# Patient Record
Sex: Male | Born: 1975 | Race: White | Hispanic: No | Marital: Single | Smoking: Former smoker
Health system: Southern US, Community
[De-identification: ages and names within clinical notes are randomized; demographics above are authoritative.]

## PROBLEM LIST (undated history)

## (undated) DIAGNOSIS — I10 Essential (primary) hypertension: Secondary | ICD-10-CM

## (undated) DIAGNOSIS — T7840XA Allergy, unspecified, initial encounter: Secondary | ICD-10-CM

## (undated) DIAGNOSIS — A071 Giardiasis [lambliasis]: Secondary | ICD-10-CM

## (undated) DIAGNOSIS — R55 Syncope and collapse: Secondary | ICD-10-CM

## (undated) HISTORY — DX: Allergy, unspecified, initial encounter: T78.40XA

## (undated) HISTORY — DX: Essential (primary) hypertension: I10

## (undated) HISTORY — DX: Giardiasis (lambliasis): A07.1

## (undated) HISTORY — DX: Syncope and collapse: R55

---
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
## 1998-05-29 HISTORY — PX: SPERMATOCELECTOMY: SHX2420

## 1998-12-27 ENCOUNTER — Ambulatory Visit (HOSPITAL_BASED_OUTPATIENT_CLINIC_OR_DEPARTMENT_OTHER): Admission: RE | Admit: 1998-12-27 | Discharge: 1998-12-27 | Payer: Self-pay | Admitting: Urology

## 2001-01-24 ENCOUNTER — Emergency Department (HOSPITAL_COMMUNITY): Admission: EM | Admit: 2001-01-24 | Discharge: 2001-01-24 | Payer: Self-pay | Admitting: Emergency Medicine

## 2001-01-24 ENCOUNTER — Encounter: Payer: Self-pay | Admitting: Emergency Medicine

## 2004-12-23 ENCOUNTER — Ambulatory Visit: Payer: Self-pay | Admitting: Family Medicine

## 2004-12-27 ENCOUNTER — Ambulatory Visit: Payer: Self-pay | Admitting: Internal Medicine

## 2005-01-16 ENCOUNTER — Ambulatory Visit: Payer: Self-pay | Admitting: Internal Medicine

## 2005-10-10 ENCOUNTER — Ambulatory Visit: Payer: Self-pay | Admitting: Internal Medicine

## 2005-11-24 ENCOUNTER — Ambulatory Visit: Payer: Self-pay | Admitting: Internal Medicine

## 2006-02-20 ENCOUNTER — Ambulatory Visit: Payer: Self-pay | Admitting: Internal Medicine

## 2006-11-30 ENCOUNTER — Emergency Department (HOSPITAL_COMMUNITY): Admission: EM | Admit: 2006-11-30 | Discharge: 2006-11-30 | Payer: Self-pay | Admitting: Emergency Medicine

## 2007-05-30 DIAGNOSIS — A071 Giardiasis [lambliasis]: Secondary | ICD-10-CM

## 2007-05-30 HISTORY — DX: Giardiasis (lambliasis): A07.1

## 2007-07-18 ENCOUNTER — Encounter: Payer: Self-pay | Admitting: Internal Medicine

## 2007-12-24 ENCOUNTER — Ambulatory Visit (HOSPITAL_COMMUNITY): Admission: RE | Admit: 2007-12-24 | Discharge: 2007-12-24 | Payer: Self-pay | Admitting: Gastroenterology

## 2007-12-24 ENCOUNTER — Encounter (INDEPENDENT_AMBULATORY_CARE_PROVIDER_SITE_OTHER): Payer: Self-pay | Admitting: Gastroenterology

## 2008-12-04 ENCOUNTER — Telehealth: Payer: Self-pay | Admitting: Internal Medicine

## 2009-03-05 ENCOUNTER — Emergency Department (HOSPITAL_COMMUNITY): Admission: EM | Admit: 2009-03-05 | Discharge: 2009-03-05 | Payer: Self-pay | Admitting: Family Medicine

## 2010-10-11 NOTE — Op Note (Signed)
William Dorsey, William Dorsey                ACCOUNT NO.:  [id number]   MEDICAL RECORD NO.:  [id number]          PATIENT TYPE:  AMB   LOCATION:  ENDO                         FACILITY:  [facility]   PHYSICIAN:  Anselmo Rod, M.D.  DATE OF BIRTH:  July 13, 1975   DATE OF PROCEDURE:  12/24/2007  DATE OF DISCHARGE:                               OPERATIVE REPORT   PROCEDURE PERFORMED:  Esophagogastroduodenoscopy with small bowel  biopsies.   ENDOSCOPIST:  Anselmo Rod, M.D.   INSTRUMENT USED:  Pentax video panendoscope.   INDICATIONS FOR PROCEDURE:  A 35 year old white male with a history of  severe gas, bloating, diarrhea and abdominal discomfort, undergoing EGD  to rule out celiac sprue.   PREPROCEDURE PREPARATION:  Informed consent was procured from the  patient.  The patient fasted for 8 hours prior to the procedure.  The  risks and benefits of the procedure were discussed with the patient in  detail.   PREPROCEDURE PHYSICAL:  VITAL SIGNS:  The patient had stable vital  signs.  NECK:  Supple.  CHEST:  Clear to auscultation.  HEART:  S1-S2 regular.  ABDOMEN:  Soft with normal bowel sounds.   DESCRIPTION OF PROCEDURE:  The patient was placed in the left lateral  decubitus position, sedated with 100 mcg of Fentanyl and 10 mg of Versed  given intravenously in slow incremental doses.  Once the patient was  adequately sedated and maintained on low-flow oxygen and continuous  cardiac monitoring, the Pentax video panendoscope was advanced through  the mouthpiece over the tongue into the esophagus under direct vision.  The vocal cords appeared healthy.  The entire esophagus was widely  patent with no evidence of ring, stricture, mass, esophagitis or  Barrett's mucosa.  The Z-line appeared healthy.  The scope was then  advanced in the stomach.  Mild antral gastritis was noted.  The rest of  gastric mucosa appeared healthy.  Retroflexion in the high cardia  revealed no abnormality.  The proximal  small bowel appeared normal.  Small-bowel biopsies were done to rule out sprue.  The patient tolerated  the procedure well without complications.   IMPRESSION:  1. Normal-appearing esophagus and gastroesophageal junction.  2. Mild antral gastritis.  3. Normal proximal small bowel.  Small-bowel biopsies done to rule out      sprue.   RECOMMENDATIONS:  1. Await pathology results.  2. Proceed with a colonoscopy at this time.  3. Further recommendation to be made thereafter.  4. Avoid all nonsteroidals for now.      Anselmo Rod, M.D.  Electronically Signed     JNM/MEDQ  D:  12/25/2007  T:  12/25/2007  Job:  [id number]   cc:   Elease Hashimoto A. Benedetto Goad, M.D.  Fax: [phone]

## 2010-10-11 NOTE — Op Note (Signed)
NAMERAMESES, OU                ACCOUNT NO.:  [id number]   MEDICAL RECORD NO.:  [id number]          PATIENT TYPE:  AMB   LOCATION:  ENDO                         FACILITY:  [facility]   PHYSICIAN:  Anselmo Rod, M.D.  DATE OF BIRTH:  [birth year]   DATE OF PROCEDURE:  12/24/2007  DATE OF DISCHARGE:                               OPERATIVE REPORT   PROCEDURE PERFORMED:  Colonoscopy with multiple cold biopsies.   ENDOSCOPIST:  Dr. Anselmo Rod.   INSTRUMENT USED:  Pentax video colonoscope.   INDICATIONS FOR PROCEDURE:  A 35 year old white male with a history of  change in bowel habits gas, bloating and diarrhea undergoing colonoscopy  to rule out microscopic colitis, masses, polyps, IBD, etc.   PREPROCEDURE PREPARATION:  Informed consent was procured from the  patient.  The patient fasted for 8 hours prior to the procedure and  prepped with OsmoPrep 32 the night prior to the procedure.  The risks  and benefits of the procedure including a 10% missed rate of cancer and  polyp were discussed with the patient as well.   PREPROCEDURE PHYSICAL:  VITAL SIGNS:  The patient had stable vital  signs.  NECK:  Supple.  CLEAR:  Clear to auscultation.  HEART:  S1-S2 regular.  ABDOMEN:  Soft with normal bowel sounds.   DESCRIPTION OF PROCEDURE:  The patient was placed in the left lateral  decubitus position and sedated with an additional 50 mcg of Fentanyl and  5 mg of Versed given intravenously in slow incremental doses.  Once the  patient was adequately sedated and maintained on low-flow oxygen and  continuous cardiac monitor, the Pentax video colonoscope was advanced  from the rectum to the cecum.  The appendiceal orifice and the cecum  were clearly visualized and photographed.  The terminal ileum appeared  healthy without lesions.  Patchy loss of vascular markings were noted.  Biopsies were done to the presence of microscopic colitis.  Small  internal hemorrhoids were seen on  retroflexion.  The rest of the exam  was unremarkable.  No masses, polyps, erosions, ulcerations or  diverticula were identified.  There was no evidence of IBD.  The patient  tolerated the procedure well without immediate complications.   IMPRESSION:  1. Patchy loss of vascular markings.  Multiple biopsies done to rule      out microscopic colitis.  2. Small internal hemorrhoids.   RECOMMENDATIONS:  1. Await pathology results.  2. Avoid all nonsteroidals, including aspirin for the next 2 weeks.  3. Further recommendation will be made depending on the biopsy      results.      Anselmo Rod, M.D.  Electronically Signed     JNM/MEDQ  D:  12/25/2007  T:  12/25/2007  Job:  [id number]   cc:   Elease Hashimoto A. Benedetto Goad, M.D.  Fax: [phone]

## 2010-11-24 ENCOUNTER — Other Ambulatory Visit: Payer: Self-pay | Admitting: *Deleted

## 2010-11-24 ENCOUNTER — Encounter: Payer: Self-pay | Admitting: Family

## 2010-11-24 MED ORDER — POTASSIUM CHLORIDE CRYS ER 20 MEQ PO TBCR: 20.0000 meq | EXTENDED_RELEASE_TABLET | Freq: Every day | ORAL | Status: DC

## 2010-11-24 MED ORDER — LISINOPRIL-HYDROCHLOROTHIAZIDE 20-25 MG PO TABS: 1.0000 | ORAL_TABLET | Freq: Every day | ORAL | Status: DC

## 2010-11-24 NOTE — Telephone Encounter (Signed)
Addended by: Noralee Space on: 11/24/2010 02:39 PM   Modules accepted: Orders

## 2010-11-25 ENCOUNTER — Ambulatory Visit (INDEPENDENT_AMBULATORY_CARE_PROVIDER_SITE_OTHER): Payer: 59 | Admitting: Family

## 2010-11-25 ENCOUNTER — Encounter: Payer: Self-pay | Admitting: Family

## 2010-11-25 DIAGNOSIS — G4700 Insomnia, unspecified: Secondary | ICD-10-CM | POA: Insufficient documentation

## 2010-11-25 DIAGNOSIS — K649 Unspecified hemorrhoids: Secondary | ICD-10-CM | POA: Insufficient documentation

## 2010-11-25 DIAGNOSIS — I10 Essential (primary) hypertension: Secondary | ICD-10-CM | POA: Insufficient documentation

## 2010-11-25 LAB — BASIC METABOLIC PANEL
CO2: 26 mEq/L (ref 19–32)
Calcium: 9.8 mg/dL (ref 8.4–10.5)
Chloride: 101 mEq/L (ref 96–112)
Glucose, Bld: 91 mg/dL (ref 70–99)
Potassium: 4.1 mEq/L (ref 3.5–5.3)
Sodium: 139 mEq/L (ref 135–145)

## 2010-11-25 MED ORDER — HYDROCORTISONE ACE-PRAMOXINE 1-1 % RE FOAM: 1.0000 | Freq: Two times a day (BID) | RECTAL | Status: AC

## 2010-11-25 MED ORDER — ALPRAZOLAM 0.25 MG PO TABS: 0.2500 mg | ORAL_TABLET | Freq: Every evening | ORAL | Status: AC | PRN

## 2010-11-25 MED ORDER — FLUNISOLIDE 29 MCG/ACT NA SOLN: 2.0000 | Freq: Two times a day (BID) | NASAL | Status: DC

## 2010-11-25 NOTE — Assessment & Plan Note (Signed)
BP Readings from Last 3 Encounters:  11/25/10 130/86  BP stable on current regimen, continue same, check BMET

## 2010-11-25 NOTE — Progress Notes (Signed)
Subjective:    Patient ID: William Dorsey, male    DOB: [birth year], 35 y.o.   MRN: [id number]  HPI  Insomnia-  Reports that he was on celexa- took for 1 month.  Felt very somnolent on that medication and had to discontinue.  Falls asleep, but then wakes up and thinks about what he needs to do.  When he worked nights he reports that he took alprazolam 0.25mg  at bedtime and slept "like a baby."  HTN- reports that dr. Jones Broom refilled his lisinopril-hctz and Kdur.  Denies, HA, swelling, SOB  Fainting spells-  reports 4 episodes total- last episode was 2002.  W/u was negative. 2 were in the shower, ?vasovagal syncope.  Hemorrhoids- flare up occasionally, uses proctofoam PRN.    Review of Systems  Constitutional: Negative for fever.  HENT: Negative for hearing loss.   Eyes: Negative for visual disturbance.  Respiratory: Negative for shortness of breath.   Cardiovascular: Negative for chest pain.  Gastrointestinal: Negative for diarrhea.  Genitourinary: Negative for dysuria and urgency.  Musculoskeletal: Negative for back pain.  Skin:       Eczema right ring finger  Neurological: Negative for weakness and numbness.  Hematological: Negative for adenopathy.  Psychiatric/Behavioral:       Denies concerns with anxiety or depression   Past Medical History  Diagnosis Date  . Allergy   . Fainting spell   . Hypertension   . Giardia 2009    History   Social History  . Marital Status: Single    Spouse Name: N/A    Number of Children: 0  . Years of Education: N/A   Occupational History  . Nurse in Cath Lab at [employer]   Social History Main Topics  . Smoking status: Former Smoker -- 10 years  . Smokeless tobacco: Never Used  . Alcohol Use: 3.6 oz/week    6 Cans of beer per week     on weekends  . Drug Use: No  . Sexually Active: Not on file   Other Topics Concern  . Not on file   Social History Narrative   Regular Exercise: 6 days weeklyCaffeine use:  4 cups  coffee dailyWorks in cath lab.  3 dogs (2 schnauzers and a terrier pound dog)Has a partner who lives in [place]- he works in the Radi[employer], therapeutic.     Past Surgical History  Procedure Date  . Tonsillectomy 1992    Done in [place]  . Spermatocelectomy 2000    Dr Isabel Caprice    Family History  Problem Relation Age of Onset  . Hypertension Mother   . Hyperlipidemia Father   . Hypertension Father   . Gout Father   . Stroke Maternal Grandmother   . Hypertension Maternal Grandmother   . Cancer Maternal Grandfather     lung  . Arthritis Paternal Grandmother     rheumatoid  . Arthritis Paternal Grandfather     rheumatoid  . Gout Paternal Grandfather     No Known Allergies  Current Outpatient Prescriptions on File Prior to Visit  Medication Sig Dispense Refill  . lisinopril-hydrochlorothiazide (PRINZIDE,ZESTORETIC) 20-25 MG per tablet Take 1 tablet by mouth daily.  90 tablet  1  . potassium chloride SA (KLOR-CON M20) 20 MEQ tablet Take 1 tablet (20 mEq total) by mouth daily.  90 tablet  1    BP 130/86  Pulse 72  Temp(Src) 98 F (36.7 C) (Oral)  Resp 16  Ht 5' 9.5" (1.765 m)  Wt 195 lb  1.9 oz (88.506 kg)  BMI 28.40 kg/m2       Objective:   Physical Exam  Constitutional: He appears well-developed and well-nourished.  HENT:  Head: Normocephalic and atraumatic.  Right Ear: Tympanic membrane normal.  Left Ear: Tympanic membrane normal.  Nose: Nose normal.  Mouth/Throat: Uvula is midline, oropharynx is clear and moist and mucous membranes are normal.  Cardiovascular: Normal rate and regular rhythm.   Pulmonary/Chest: Effort normal and breath sounds normal.  Skin: Skin is warm and dry.  Psychiatric: He has a normal mood and affect. His behavior is normal. Judgment and thought content normal.          Assessment & Plan:

## 2010-11-25 NOTE — Assessment & Plan Note (Signed)
Stable, continue proctofoam PRN

## 2010-11-25 NOTE — Assessment & Plan Note (Signed)
Suspect that mild anxiety is playing a role.  Intolerant to [employer]  Will rx low dose alprazolam.

## 2010-11-25 NOTE — Patient Instructions (Signed)
Please follow up fasting this summer for a complete physical.

## 2010-11-27 ENCOUNTER — Encounter: Payer: Self-pay | Admitting: Family

## 2011-08-16 ENCOUNTER — Ambulatory Visit (INDEPENDENT_AMBULATORY_CARE_PROVIDER_SITE_OTHER): Payer: 59 | Admitting: Family

## 2011-08-16 ENCOUNTER — Encounter: Payer: Self-pay | Admitting: Family

## 2011-08-16 VITALS — BP 130/96 | HR 59 | Temp 97.7°F | Resp 16 | Ht 69.5 in | Wt 187.1 lb

## 2011-08-16 DIAGNOSIS — G4700 Insomnia, unspecified: Secondary | ICD-10-CM

## 2011-08-16 DIAGNOSIS — M2550 Pain in unspecified joint: Secondary | ICD-10-CM

## 2011-08-16 DIAGNOSIS — I10 Essential (primary) hypertension: Secondary | ICD-10-CM

## 2011-08-16 DIAGNOSIS — Z0000 Encounter for general adult medical examination without abnormal findings: Secondary | ICD-10-CM | POA: Insufficient documentation

## 2011-08-16 LAB — LIPID PANEL
Cholesterol: 170 mg/dL (ref 0–200)
Triglycerides: 131 mg/dL (ref <150)

## 2011-08-16 LAB — CBC WITH DIFFERENTIAL/PLATELET
Basophils Absolute: 0 10*3/uL (ref 0.0–0.1)
Basophils Relative: 0 % (ref 0–1)
Eosinophils Absolute: 0.1 10*3/uL (ref 0.0–0.7)
Eosinophils Relative: 2 % (ref 0–5)
HCT: 46.5 % (ref 39.0–52.0)
MCH: 31 pg (ref 26.0–34.0)
MCHC: 35.5 g/dL (ref 30.0–36.0)
MCV: 87.2 fL (ref 78.0–100.0)
Monocytes Absolute: 0.4 10*3/uL (ref 0.1–1.0)
RDW: 12.4 % (ref 11.5–15.5)

## 2011-08-16 LAB — BASIC METABOLIC PANEL WITH GFR
CO2: 22 mEq/L (ref 19–32)
Calcium: 9.4 mg/dL (ref 8.4–10.5)
Creat: 0.93 mg/dL (ref 0.50–1.35)
GFR, Est African American: >89 mL/min
Sodium: 137 mEq/L (ref 135–145)

## 2011-08-16 LAB — HEPATIC FUNCTION PANEL
ALT: 21 U/L (ref 0–53)
AST: 22 U/L (ref 0–37)
Total Protein: 7.1 g/dL (ref 6.0–8.3)

## 2011-08-16 MED ORDER — LISINOPRIL 10 MG PO TABS: 10.0000 mg | ORAL_TABLET | Freq: Every day | ORAL | Status: DC

## 2011-08-16 NOTE — Patient Instructions (Signed)
Please complete your blood work prior to leaving today. Follow up in 1 month. 

## 2011-08-16 NOTE — Assessment & Plan Note (Signed)
I commended patient on his healthy diet, exercise. Immunizations reviewed, up to date- per pt.  Obtain fasting laboratories.

## 2011-08-16 NOTE — Assessment & Plan Note (Signed)
Stable with PRN xanax.

## 2011-08-16 NOTE — Progress Notes (Signed)
Subjective:    Patient ID: William Dorsey, male    DOB: 1976/04/15, 36 y.o.   MRN: [id number]  HPI  William Dorsey is a 36 yr old male who presents today for complete physical.  Preventative- last tetanus- thinks <10 yrs ago. Exercise-training for a marathon Diet- Eats healthy.     HTN- currently on lisinopril-HCTZ- but has been "weaning" himself down.  No only on 1/4 pill. Reports that he is training for a marathon and "runs" much better when he is off of the BP meds.   Insomnia- uses xanax about 2x a month.    Review of Systems  Constitutional: Negative for unexpected weight change.  HENT: Negative for congestion.   Eyes: Negative for visual disturbance.  Respiratory: Negative for cough.   Cardiovascular: Negative for chest pain.  Gastrointestinal: Positive for vomiting. Negative for diarrhea, constipation and blood in stool.  Genitourinary: Negative for dysuria, frequency and hematuria.  Musculoskeletal: Negative for myalgias.       Some joint pain in hands.    Neurological: Negative for headaches.  Hematological: Negative for adenopathy.  Psychiatric/Behavioral:       Denies depression/anxiety   Past Medical History  Diagnosis Date  . Allergy   . Fainting spell   . Hypertension   . Giardia 2009    History   Social History  . Marital Status: Single    Spouse Name: N/A    Number of Children: 0  . Years of Education: N/A   Occupational History  . Nurse in Cath Lab at [employer]   Social History Main Topics  . Smoking status: Former Smoker -- 10 years  . Smokeless tobacco: Never Used  . Alcohol Use: 3.6 oz/week    6 Cans of beer per week     on weekends  . Drug Use: No  . Sexually Active: Not on file   Other Topics Concern  . Not on file   Social History Narrative   Regular Exercise: 6 days weeklyCaffeine use:  4 cups coffee dailyWorks in cath lab.  3 dogs (2 schnauzers and a terrier pound dog)Has a partner who lives in [place]- he works in the  Radiographer, therapeutic.     Past Surgical History  Procedure Date  . Tonsillectomy 1992    Done in [place]  . Spermatocelectomy 2000    Dr Isabel Caprice    Family History  Problem Relation Age of Onset  . Hypertension Mother   . Hyperlipidemia Father   . Hypertension Father   . Gout Father   . Stroke Maternal Grandmother   . Hypertension Maternal Grandmother   . Cancer Maternal Grandfather     lung  . Arthritis Paternal Grandmother     rheumatoid  . Arthritis Paternal Grandfather     rheumatoid  . Gout Paternal Grandfather     No Known Allergies  Current Outpatient Prescriptions on File Prior to Visit  Medication Sig Dispense Refill  . flunisolide (NASAREL) 29 MCG/ACT (0.025%) nasal spray Place 2 sprays into the nose 2 (two) times daily. Dose is for each nostril.  25 mL  3  . loratadine (CLARITIN) 10 MG tablet Take 10 mg by mouth as needed.        . Pramoxine HCl (PROCTOFOAM RE) Place rectally as needed.          BP 130/96  Pulse 59  Temp(Src) 97.7 F (36.5 C) (Oral)  Resp 16  Ht 5' 9.5" (1.765 m)  Wt 187 lb 1.3  oz (84.859 kg)  BMI 27.23 kg/m2  SpO2 99%        Objective:   Physical Exam Physical Exam  Constitutional: He is oriented to person, place, and time. He appears well-developed and well-nourished. No distress.  HENT:  Head: Normocephalic and atraumatic.  Right Ear: Tympanic membrane and ear canal normal.  Left Ear: Tympanic membrane and ear canal normal.  Mouth/Throat: Oropharynx is clear and moist.  Eyes: Pupils are equal, round, and reactive to light. No scleral icterus.  Neck: Normal range of motion. No thyromegaly present.  Cardiovascular: Normal rate and regular rhythm.   No murmur heard. Pulmonary/Chest: Effort normal and breath sounds normal. No respiratory distress. He has no wheezes. He has no rales. He exhibits no tenderness.  Abdominal: Soft. Bowel sounds are normal. He exhibits no distension and no mass. There is no tenderness. There is no  rebound and no guarding.  Musculoskeletal: He exhibits no edema.  Lymphadenopathy:    He has no cervical adenopathy.  Neurological: He is alert and oriented to person, place, and time. He exhibits normal muscle tone. Coordination normal.  Skin: Skin is warm and dry.  Psychiatric: He has a normal mood and affect. His behavior is normal. Judgment and thought content normal.          Assessment & Plan:         Assessment & Plan:

## 2011-08-16 NOTE — Assessment & Plan Note (Signed)
Deteriorated.   BP Readings from Last 3 Encounters:  08/16/11 130/96  11/25/10 130/86   He wishes to try lisinopril 10mg  once daily alone. This is reasonable.  Will plan follow up in 1 month.

## 2011-08-17 LAB — HIV ANTIBODY (ROUTINE TESTING W REFLEX): HIV: NONREACTIVE

## 2011-08-17 LAB — RHEUMATOID FACTOR: Rhuematoid fact SerPl-aCnc: <10 IU/mL (ref <=14)

## 2011-09-20 ENCOUNTER — Encounter: Payer: Self-pay | Admitting: Family

## 2011-09-20 ENCOUNTER — Ambulatory Visit (INDEPENDENT_AMBULATORY_CARE_PROVIDER_SITE_OTHER): Payer: 59 | Admitting: Family

## 2011-09-20 VITALS — BP 144/82 | HR 61 | Temp 98.1°F | Resp 16 | Ht 69.5 in | Wt 184.1 lb

## 2011-09-20 DIAGNOSIS — I10 Essential (primary) hypertension: Secondary | ICD-10-CM

## 2011-09-20 MED ORDER — ALPRAZOLAM 0.25 MG PO TABS: 0.2500 mg | ORAL_TABLET | Freq: Every evening | ORAL | Status: DC | PRN

## 2011-09-20 MED ORDER — LISINOPRIL 10 MG PO TABS: 10.0000 mg | ORAL_TABLET | Freq: Every day | ORAL | Status: DC

## 2011-09-20 NOTE — Patient Instructions (Signed)
Please schedule a follow up appointment in 3 months.

## 2011-09-20 NOTE — Progress Notes (Signed)
  Subjective:    Patient ID: WAYLYN TENBRINK, male    DOB: [birth year], 36 y.o.   MRN: [id number]  HPI  Mr.  Wendell is a 36 yr old male who presents today for follow up of his blood pressure.  Last visit his lisinopril was increased and HCTZ was eliminated.  He reports that he has been checking his sugars regularly at work and they have been running 120's/70-80's. He is feeling better off of the hctz.  He has been doing a lot of running and is feeling good running.   Review of Systems     Objective:   Physical Exam  Constitutional: He appears well-developed and well-nourished.  Cardiovascular: Normal rate and regular rhythm.   Pulmonary/Chest: Effort normal and breath sounds normal.  Musculoskeletal: He exhibits no edema.          Assessment & Plan:

## 2011-09-21 NOTE — Assessment & Plan Note (Signed)
BP Readings from Last 3 Encounters:  09/20/11 144/82  08/16/11 130/96  11/25/10 130/86  DBP improved, SBP slightly above goal in office, but readings at work have been much better.  Will continue current dose and plan to have him follow up in 3 months.

## 2011-10-16 ENCOUNTER — Other Ambulatory Visit: Payer: Self-pay | Admitting: Family

## 2011-10-16 NOTE — Telephone Encounter (Signed)
Lisinopril request denied as refills was sent to pharmacy on 09/20/11 #90 x no refills. Sent note to check refill on file.

## 2012-01-08 ENCOUNTER — Other Ambulatory Visit: Payer: Self-pay | Admitting: Family

## 2012-01-08 NOTE — Telephone Encounter (Signed)
Please let pt know that a 90 day supply of lisinopril was sent to his pharmacy. He was due for f/u in July. Please call pt to arrange appt as further refills cannot be given until he is seen.

## 2012-01-09 NOTE — Telephone Encounter (Signed)
Left detailed message informing patient that a 90 day supply of lisinopril has been sent to pharmacy and that patient needs to be seen for follow up before additional refills can be given.

## 2012-01-20 ENCOUNTER — Encounter: Payer: Self-pay | Admitting: Family

## 2012-04-16 ENCOUNTER — Encounter: Payer: Self-pay | Admitting: Family

## 2012-04-16 ENCOUNTER — Ambulatory Visit (INDEPENDENT_AMBULATORY_CARE_PROVIDER_SITE_OTHER): Payer: 59 | Admitting: Family

## 2012-04-16 VITALS — BP 144/90 | HR 67 | Temp 98.3°F | Resp 16 | Ht 69.5 in | Wt 190.1 lb

## 2012-04-16 DIAGNOSIS — I10 Essential (primary) hypertension: Secondary | ICD-10-CM

## 2012-04-16 MED ORDER — LOSARTAN POTASSIUM 25 MG PO TABS: 25.0000 mg | ORAL_TABLET | Freq: Every day | ORAL | Status: DC

## 2012-04-16 NOTE — Patient Instructions (Addendum)
Please schedule a follow up appointment in 1 month.

## 2012-04-16 NOTE — Assessment & Plan Note (Signed)
Will give trial of losartan. If no improvement try norvasc- he has tried in the past- does not remember if side effects.

## 2012-04-16 NOTE — Progress Notes (Signed)
Subjective:    Patient ID: William Dorsey, male    DOB: 1975/11/09, 36 y.o.   MRN: [id number]  HPI  William Dorsey is a 36 yr old male who presents today for follow up of his HTN.   He reports + fatigue on lisinopril.  Only taking every other day as a result.  Reports HA when blood pressure is this high.   Did not take today.  He has tried multiple other medications in the past which he has been intolerant to includng benicar/diovan, beta blocker,lotrel.  He would like to try losartan.   Review of Systems See HPI  Past Medical History  Diagnosis Date  . Allergy   . Fainting spell   . Hypertension   . Giardia 2009    History   Social History  . Marital Status: Single    Spouse Name: N/A    Number of Children: 0  . Years of Education: N/A   Occupational History  . Nurse in Cath Lab at [employer]   Social History Main Topics  . Smoking status: Former Smoker -- 10 years  . Smokeless tobacco: Never Used  . Alcohol Use: 3.6 oz/week    6 Cans of beer per week     Comment: on weekends  . Drug Use: No  . Sexually Active: Not on file   Other Topics Concern  . Not on file   Social History Narrative   Regular Exercise: 6 days weeklyCaffeine use:  4 cups coffee dailyWorks in cath lab.  3 dogs (2 schnauzers and a terrier pound dog)Has a partner who lives in [place]- he works in the Radiographer, therapeutic.     Past Surgical History  Procedure Date  . Tonsillectomy 1992    Done in [place]  . Spermatocelectomy 2000    Dr Isabel Caprice    Family History  Problem Relation Age of Onset  . Hypertension Mother   . Hyperlipidemia Father   . Hypertension Father   . Gout Father   . Stroke Maternal Grandmother   . Hypertension Maternal Grandmother   . Cancer Maternal Grandfather     lung  . Arthritis Paternal Grandmother     rheumatoid  . Arthritis Paternal Grandfather     rheumatoid  . Gout Paternal Grandfather     No Known Allergies  Current Outpatient Prescriptions on File  Prior to Visit  Medication Sig Dispense Refill  . ALPRAZolam (XANAX) 0.25 MG tablet Take 1 tablet (0.25 mg total) by mouth at bedtime as needed.  30 tablet  0  . flunisolide (NASAREL) 29 MCG/ACT (0.025%) nasal spray Place 2 sprays into the nose 2 (two) times daily. Dose is for each nostril.  25 mL  3  . loratadine (CLARITIN) 10 MG tablet Take 10 mg by mouth as needed.        . Multiple Vitamin (MULTIVITAMIN) tablet Take 1 tablet by mouth daily.      . Pramoxine HCl (PROCTOFOAM RE) Place rectally as needed.        Marland Kitchen losartan (COZAAR) 25 MG tablet Take 1 tablet (25 mg total) by mouth daily.  30 tablet  0    BP 144/90  Pulse 67  Temp 98.3 F (36.8 C) (Oral)  Resp 16  Ht 5' 9.5" (1.765 m)  Wt 190 lb 1.9 oz (86.238 kg)  BMI 27.67 kg/m2  SpO2 97%       Objective:   Physical Exam  Constitutional: He appears well-developed and well-nourished. No distress.  Cardiovascular: Normal rate and regular rhythm.   No murmur heard. Pulmonary/Chest: Effort normal and breath sounds normal. No respiratory distress. He has no wheezes. He has no rales. He exhibits no tenderness.  Psychiatric: He has a normal mood and affect. His behavior is normal. Judgment and thought content normal.          Assessment & Plan:

## 2012-05-13 ENCOUNTER — Encounter: Payer: Self-pay | Admitting: Family

## 2012-05-13 ENCOUNTER — Ambulatory Visit (INDEPENDENT_AMBULATORY_CARE_PROVIDER_SITE_OTHER): Payer: 59 | Admitting: Family

## 2012-05-13 VITALS — BP 144/92 | HR 52 | Temp 98.6°F | Resp 16 | Ht 69.5 in | Wt 189.0 lb

## 2012-05-13 DIAGNOSIS — I10 Essential (primary) hypertension: Secondary | ICD-10-CM

## 2012-05-13 MED ORDER — LOSARTAN POTASSIUM 50 MG PO TABS: 50.0000 mg | ORAL_TABLET | Freq: Every day | ORAL | Status: DC

## 2012-05-13 NOTE — Assessment & Plan Note (Signed)
DBP still elevated.  Obtain BMET today.  Increase losartan from 25 to 50mg .  Follow up in 1 month.

## 2012-05-13 NOTE — Patient Instructions (Addendum)
Please complete your lab work prior to leaving.  Please schedule a follow up appointment in 1 months.

## 2012-05-13 NOTE — Progress Notes (Signed)
  Subjective:    Patient ID: William Dorsey, male    DOB: Feb 25, 1976, 36 y.o.   MRN: [id number]  HPI   William Dorsey is a 36 yr old male who presents today for follow up of his HTN. Reports overall feeling much better since switching from lisinopril to losartan.  At home and at work he has been checking his blood pressure and it generally is running 125-135 systolic, but the diastolic number is consistently above 90.  Reports some mild issues with sleep as well as constipation which are good.  He does however wish to continue the medication.  Review of Systems See HPI      Objective:   Physical Exam  Constitutional: He appears well-developed and well-nourished. No distress.  Eyes: No scleral icterus.  Cardiovascular: Normal rate and regular rhythm.   No murmur heard. Pulmonary/Chest: Effort normal and breath sounds normal. No respiratory distress. He has no wheezes. He has no rales. He exhibits no tenderness.  Musculoskeletal: He exhibits no edema.  Psychiatric: He has a normal mood and affect. His behavior is normal. Judgment and thought content normal.          Assessment & Plan:

## 2012-05-14 LAB — BASIC METABOLIC PANEL
BUN: 17 mg/dL (ref 6–23)
CO2: 26 mEq/L (ref 19–32)
Chloride: 102 mEq/L (ref 96–112)
Creat: 0.84 mg/dL (ref 0.50–1.35)

## 2012-06-12 ENCOUNTER — Ambulatory Visit (INDEPENDENT_AMBULATORY_CARE_PROVIDER_SITE_OTHER): Payer: 59 | Admitting: Family

## 2012-06-12 ENCOUNTER — Encounter: Payer: Self-pay | Admitting: Family

## 2012-06-12 VITALS — BP 140/94 | HR 64 | Temp 97.8°F | Resp 16 | Ht 69.5 in | Wt 187.0 lb

## 2012-06-12 DIAGNOSIS — I10 Essential (primary) hypertension: Secondary | ICD-10-CM

## 2012-06-12 LAB — BASIC METABOLIC PANEL
BUN: 22 mg/dL (ref 6–23)
Chloride: 104 mEq/L (ref 96–112)
Creat: 0.9 mg/dL (ref 0.50–1.35)

## 2012-06-12 MED ORDER — LOSARTAN POTASSIUM 50 MG PO TABS: 50.0000 mg | ORAL_TABLET | Freq: Every day | ORAL | Status: DC

## 2012-06-12 NOTE — Progress Notes (Signed)
Subjective:    Patient ID: William Dorsey, male    DOB: Jan 30, 1976, 37 y.o.   MRN: [id number]  HPI  William Dorsey is a 37 yr old male who presents today for follow up of his htn. Last visit losartan was increased from 25 to 50mg .  Reports that hs has been checking his blood pressure frequently at work and it has been 120's/70's.  Occasional DBP in the  80' systolic.  Mild fatigue. Denies cp/sob. Has been running regularly without difficulty.  Review of Systems See HPI  Past Medical History  Diagnosis Date  . Allergy   . Fainting spell   . Hypertension   . Giardia 2009    History   Social History  . Marital Status: Single    Spouse Name: N/A    Number of Children: 0  . Years of Education: N/A   Occupational History  . Nurse in Cath Lab at [employer]   Social History Main Topics  . Smoking status: Former Smoker -- 10 years  . Smokeless tobacco: Never Used  . Alcohol Use: 3.6 oz/week    6 Cans of beer per week     Comment: on weekends  . Drug Use: No  . Sexually Active: Not on file   Other Topics Concern  . Not on file   Social History Narrative   Regular Exercise: 6 days weeklyCaffeine use:  4 cups coffee dailyWorks in cath lab.  3 dogs (2 schnauzers and a terrier pound dog)Has a partner who lives in [place]- he works in the Radiographer, therapeutic.     Past Surgical History  Procedure Date  . Tonsillectomy 1992    Done in [place]  . Spermatocelectomy 2000    Dr Isabel Caprice    Family History  Problem Relation Age of Onset  . Hypertension Mother   . Hyperlipidemia Father   . Hypertension Father   . Gout Father   . Stroke Maternal Grandmother   . Hypertension Maternal Grandmother   . Cancer Maternal Grandfather     lung  . Arthritis Paternal Grandmother     rheumatoid  . Arthritis Paternal Grandfather     rheumatoid  . Gout Paternal Grandfather     No Known Allergies  Current Outpatient Prescriptions on File Prior to Visit  Medication Sig Dispense Refill    . ALPRAZolam (XANAX) 0.25 MG tablet Take 1 tablet (0.25 mg total) by mouth at bedtime as needed.  30 tablet  0  . Cholecalciferol (VITAMIN D) 1000 UNITS capsule Take 2,000 Units by mouth daily.      . flunisolide (NASAREL) 29 MCG/ACT (0.025%) nasal spray Place 2 sprays into the nose 2 (two) times daily. Dose is for each nostril.  25 mL  3  . loratadine (CLARITIN) 10 MG tablet Take 10 mg by mouth as needed.        Marland Kitchen losartan (COZAAR) 50 MG tablet Take 1 tablet (50 mg total) by mouth daily.  30 tablet  3  . Multiple Vitamin (MULTIVITAMIN) tablet Take 1 tablet by mouth daily.      . Pramoxine HCl (PROCTOFOAM RE) Place rectally as needed.          BP 140/94  Pulse 64  Temp 97.8 F (36.6 C) (Oral)  Resp 16  Ht 5' 9.5" (1.765 m)  Wt 187 lb (84.823 kg)  BMI 27.22 kg/m2  SpO2 98%       Objective:   Physical Exam  Constitutional: He appears well-developed and well-nourished.  No distress.  Cardiovascular: Normal rate and regular rhythm.   No murmur heard. Pulmonary/Chest: Effort normal and breath sounds normal. No respiratory distress. He has no wheezes. He has no rales. He exhibits no tenderness.          Assessment & Plan:   BP Readings from Last 3 Encounters:  06/12/12 140/94  05/13/12 144/92  04/16/12 144/90

## 2012-06-12 NOTE — Assessment & Plan Note (Signed)
BP improved overall. Higher in the office than when he checks it at work.  Continue current dose of losartan and obtain bmet to day.  Follow up in 3 months.

## 2012-06-12 NOTE — Patient Instructions (Addendum)
Please follow up in 3 months.  Complete your blood work prior to leaving.

## 2012-06-14 ENCOUNTER — Ambulatory Visit: Payer: 59 | Admitting: Family

## 2012-09-11 ENCOUNTER — Ambulatory Visit: Payer: 59 | Admitting: Family

## 2012-09-18 ENCOUNTER — Ambulatory Visit (INDEPENDENT_AMBULATORY_CARE_PROVIDER_SITE_OTHER): Payer: 59 | Admitting: Family

## 2012-09-18 ENCOUNTER — Encounter: Payer: Self-pay | Admitting: Family

## 2012-09-18 VITALS — BP 124/80 | HR 62 | Temp 98.2°F | Resp 16 | Ht 69.5 in | Wt 181.1 lb

## 2012-09-18 DIAGNOSIS — I10 Essential (primary) hypertension: Secondary | ICD-10-CM

## 2012-09-18 MED ORDER — LOSARTAN POTASSIUM 50 MG PO TABS: 50.0000 mg | ORAL_TABLET | Freq: Every day | ORAL | Status: DC

## 2012-09-18 NOTE — Progress Notes (Signed)
  Subjective:    Patient ID: William Dorsey, male    DOB: June 19, 1975, 37 y.o.   MRN: [id number]  HPI  Corban is a 37 yr old male here today for follow up of his hypertension.  He is currently maintained on losartan 50 mg. Denies cp, sob or swelling.     Review of Systems See HPI    Objective:   Physical Exam  Constitutional: He is oriented to person, place, and time. He appears well-developed and well-nourished. No distress.  HENT:  Head: Normocephalic and atraumatic.  Cardiovascular: Normal rate and regular rhythm.   No murmur heard. Pulmonary/Chest: Effort normal and breath sounds normal. No respiratory distress. He has no wheezes. He has no rales. He exhibits no tenderness.  Neurological: He is alert and oriented to person, place, and time.  Psychiatric: He has a normal mood and affect. His behavior is normal. Judgment and thought content normal.          Assessment & Plan:

## 2012-09-18 NOTE — Patient Instructions (Addendum)
Please complete blood work at [facility] in 3 months. Follow up in 6 months for a complete physical.

## 2012-09-21 NOTE — Assessment & Plan Note (Signed)
Improved.  Continue current dose of losartan.

## 2013-03-17 ENCOUNTER — Encounter: Payer: 59 | Admitting: Family

## 2013-04-03 ENCOUNTER — Other Ambulatory Visit: Payer: Self-pay

## 2015-07-15 DIAGNOSIS — Z125 Encounter for screening for malignant neoplasm of prostate: Secondary | ICD-10-CM | POA: Diagnosis not present

## 2015-07-15 DIAGNOSIS — I10 Essential (primary) hypertension: Secondary | ICD-10-CM | POA: Diagnosis not present

## 2015-07-15 DIAGNOSIS — Z79899 Other long term (current) drug therapy: Secondary | ICD-10-CM | POA: Diagnosis not present

## 2015-08-02 DIAGNOSIS — Z0000 Encounter for general adult medical examination without abnormal findings: Secondary | ICD-10-CM | POA: Diagnosis not present

## 2015-08-02 DIAGNOSIS — I10 Essential (primary) hypertension: Secondary | ICD-10-CM | POA: Diagnosis not present

## 2015-08-02 DIAGNOSIS — Z125 Encounter for screening for malignant neoplasm of prostate: Secondary | ICD-10-CM | POA: Diagnosis not present

## 2015-09-03 MED FILL — LISINOPRIL 20 MG TABLET: 20 | 90 days supply | Qty: 90 | Fill #4

## 2015-12-20 MED FILL — LISINOPRIL 20 MG TABLET: 20 | 90 days supply | Qty: 90 | Fill #0

## 2016-04-17 MED FILL — LISINOPRIL 20 MG TABLET: 20 | 90 days supply | Qty: 90 | Fill #1

## 2016-05-15 DIAGNOSIS — H52223 Regular astigmatism, bilateral: Secondary | ICD-10-CM | POA: Diagnosis not present

## 2016-05-15 DIAGNOSIS — H5213 Myopia, bilateral: Secondary | ICD-10-CM | POA: Diagnosis not present

## 2016-07-27 MED FILL — LISINOPRIL 20 MG TABLET: 20 | 90 days supply | Qty: 90 | Fill #2

## 2017-08-22 DIAGNOSIS — Z1321 Encounter for screening for nutritional disorder: Secondary | ICD-10-CM | POA: Diagnosis not present

## 2017-08-22 DIAGNOSIS — L309 Dermatitis, unspecified: Secondary | ICD-10-CM | POA: Diagnosis not present

## 2017-08-22 DIAGNOSIS — Z0000 Encounter for general adult medical examination without abnormal findings: Secondary | ICD-10-CM | POA: Diagnosis not present

## 2017-08-22 DIAGNOSIS — G4700 Insomnia, unspecified: Secondary | ICD-10-CM | POA: Diagnosis not present

## 2017-08-22 DIAGNOSIS — I10 Essential (primary) hypertension: Secondary | ICD-10-CM | POA: Diagnosis not present

## 2017-08-22 DIAGNOSIS — Z125 Encounter for screening for malignant neoplasm of prostate: Secondary | ICD-10-CM | POA: Diagnosis not present

## 2017-08-22 MED FILL — CLOBETASOL PROPIONATE 0.05: 0.05 | 30 days supply | Qty: 45 | Fill #0

## 2017-08-22 MED FILL — LISINOPRIL 20 MG TABLET: 20 | 90 days supply | Qty: 90 | Fill #0

## 2017-08-23 MED FILL — ALPRAZolam 0.25 MG TABS: 0.25 | 30 days supply | Qty: 30 | Fill #0

## 2017-12-12 MED FILL — LISINOPRIL 20 MG TABLET: 20 | 90 days supply | Qty: 90 | Fill #1

## 2018-05-27 MED FILL — LISINOPRIL 20 MG TABLET: 20 | 90 days supply | Qty: 90 | Fill #2

## 2018-08-19 DIAGNOSIS — Z125 Encounter for screening for malignant neoplasm of prostate: Secondary | ICD-10-CM | POA: Diagnosis not present

## 2018-08-19 DIAGNOSIS — Z0000 Encounter for general adult medical examination without abnormal findings: Secondary | ICD-10-CM | POA: Diagnosis not present

## 2018-08-26 DIAGNOSIS — Z125 Encounter for screening for malignant neoplasm of prostate: Secondary | ICD-10-CM | POA: Diagnosis not present

## 2018-08-26 DIAGNOSIS — G4700 Insomnia, unspecified: Secondary | ICD-10-CM | POA: Diagnosis not present

## 2018-08-26 DIAGNOSIS — Z0000 Encounter for general adult medical examination without abnormal findings: Secondary | ICD-10-CM | POA: Diagnosis not present

## 2018-08-26 DIAGNOSIS — I10 Essential (primary) hypertension: Secondary | ICD-10-CM | POA: Diagnosis not present

## 2018-08-26 DIAGNOSIS — E782 Mixed hyperlipidemia: Secondary | ICD-10-CM | POA: Diagnosis not present

## 2018-08-26 DIAGNOSIS — L309 Dermatitis, unspecified: Secondary | ICD-10-CM | POA: Diagnosis not present

## 2018-08-26 MED FILL — LISINOPRIL 20 MG TABLET: 20 | 90 days supply | Qty: 90 | Fill #0

## 2018-08-26 MED FILL — ALPRAZolam 0.25 MG TABS: 0.25 | 30 days supply | Qty: 30 | Fill #0

## 2018-11-29 MED FILL — LISINOPRIL 20 MG TABLET: 20 | 90 days supply | Qty: 90 | Fill #1

## 2019-03-10 MED FILL — LISINOPRIL 20 MG TABLET: 20 | 90 days supply | Qty: 90 | Fill #2

## 2019-06-30 MED FILL — LISINOPRIL 20 MG TABLET: 20 | 90 days supply | Qty: 90 | Fill #3

## 2019-08-25 DIAGNOSIS — Z0000 Encounter for general adult medical examination without abnormal findings: Secondary | ICD-10-CM | POA: Diagnosis not present

## 2019-10-13 ENCOUNTER — Other Ambulatory Visit (HOSPITAL_COMMUNITY): Payer: Self-pay | Admitting: Internal Medicine

## 2019-10-13 DIAGNOSIS — G4700 Insomnia, unspecified: Secondary | ICD-10-CM | POA: Diagnosis not present

## 2019-10-13 DIAGNOSIS — F419 Anxiety disorder, unspecified: Secondary | ICD-10-CM | POA: Diagnosis not present

## 2019-10-13 DIAGNOSIS — L309 Dermatitis, unspecified: Secondary | ICD-10-CM | POA: Diagnosis not present

## 2019-10-13 DIAGNOSIS — L719 Rosacea, unspecified: Secondary | ICD-10-CM | POA: Diagnosis not present

## 2019-10-13 DIAGNOSIS — I10 Essential (primary) hypertension: Secondary | ICD-10-CM | POA: Diagnosis not present

## 2019-10-13 DIAGNOSIS — Z0000 Encounter for general adult medical examination without abnormal findings: Secondary | ICD-10-CM | POA: Diagnosis not present

## 2019-10-13 MED FILL — LISINOPRIL 20 MG TABLET: 20 | 90 days supply | Qty: 90 | Fill #0

## 2019-10-13 MED FILL — CLOBETASOL PROPIONATE 0.05: 0.05 | 30 days supply | Qty: 45 | Fill #0

## 2019-10-13 MED FILL — ALPRAZolam 0.25 MG TABS: 0.25 | 30 days supply | Qty: 30 | Fill #0

## 2020-02-03 MED FILL — LISINOPRIL 20 MG TABLET: 20 | 90 days supply | Qty: 90 | Fill #1

## 2020-02-12 MED FILL — LISINOPRIL 20 MG TABLET: 20 | 90 days supply | Qty: 90 | Fill #1

## 2020-03-29 ENCOUNTER — Other Ambulatory Visit (HOSPITAL_COMMUNITY): Payer: Self-pay | Admitting: Internal Medicine

## 2020-03-29 DIAGNOSIS — R2681 Unsteadiness on feet: Secondary | ICD-10-CM | POA: Diagnosis not present

## 2020-03-29 DIAGNOSIS — R519 Headache, unspecified: Secondary | ICD-10-CM | POA: Diagnosis not present

## 2020-03-29 DIAGNOSIS — M792 Neuralgia and neuritis, unspecified: Secondary | ICD-10-CM | POA: Diagnosis not present

## 2020-03-29 DIAGNOSIS — R110 Nausea: Secondary | ICD-10-CM | POA: Diagnosis not present

## 2020-03-29 DIAGNOSIS — G8929 Other chronic pain: Secondary | ICD-10-CM | POA: Diagnosis not present

## 2020-03-29 MED FILL — CELECOXIB 200 MG CAP: 200 | 30 days supply | Qty: 30 | Fill #0

## 2020-03-30 ENCOUNTER — Other Ambulatory Visit: Payer: Self-pay | Admitting: Internal Medicine

## 2020-03-30 DIAGNOSIS — R2681 Unsteadiness on feet: Secondary | ICD-10-CM

## 2020-04-08 MED FILL — CELECOXIB 200 MG CAP: 200 | 30 days supply | Qty: 30 | Fill #0

## 2020-04-13 ENCOUNTER — Ambulatory Visit
Admission: RE | Admit: 2020-04-13 | Discharge: 2020-04-13 | Disposition: A | Discharge status: Home / Self Care | Payer: 59 | Source: Ambulatory Visit | Attending: Internal Medicine | Admitting: Internal Medicine

## 2020-04-13 ENCOUNTER — Other Ambulatory Visit: Payer: Self-pay

## 2020-04-13 DIAGNOSIS — R2681 Unsteadiness on feet: Secondary | ICD-10-CM

## 2020-04-13 DIAGNOSIS — R42 Dizziness and giddiness: Secondary | ICD-10-CM | POA: Diagnosis not present

## 2020-04-13 DIAGNOSIS — R55 Syncope and collapse: Secondary | ICD-10-CM | POA: Diagnosis not present

## 2020-04-13 DIAGNOSIS — R519 Headache, unspecified: Secondary | ICD-10-CM | POA: Diagnosis not present

## 2020-05-04 ENCOUNTER — Ambulatory Visit (INDEPENDENT_AMBULATORY_CARE_PROVIDER_SITE_OTHER): Payer: 59 | Admitting: Neurology

## 2020-05-04 ENCOUNTER — Encounter: Payer: Self-pay | Admitting: Neurology

## 2020-05-04 ENCOUNTER — Other Ambulatory Visit: Payer: Self-pay

## 2020-05-04 VITALS — BP 145/94 | HR 64 | Ht 64.5 in | Wt 182.0 lb

## 2020-05-04 DIAGNOSIS — F419 Anxiety disorder, unspecified: Secondary | ICD-10-CM | POA: Insufficient documentation

## 2020-05-04 DIAGNOSIS — R519 Headache, unspecified: Secondary | ICD-10-CM

## 2020-05-04 DIAGNOSIS — R200 Anesthesia of skin: Secondary | ICD-10-CM | POA: Insufficient documentation

## 2020-05-04 DIAGNOSIS — R5383 Other fatigue: Secondary | ICD-10-CM | POA: Diagnosis not present

## 2020-05-04 NOTE — Progress Notes (Signed)
[facility]  PATIENT: AZRIEL JAKOB DOB: Aug 20, 1975  REFERRING DOCTOR OR PCP: Dr. Nicholos Johns. SOURCE: Patient, notes from primary care, imaging and lab reports, CT scan personally reviewed.  _________________________________   HISTORICAL  CHIEF COMPLAINT:  Chief Complaint  Patient presents with  . New Patient (Initial Visit)    RM 13, alone. Paper referral from Perry, Ajith,MD for unsteadiness on feet/headaches/neuritis. wears glasses. 2nd covid vaccine caused severe headache, almost caused ambulance. Fever, chills, body aches for about 24 hours. does not feel he has recovered. Has a lot of fatigue. 03/13/20, he went to concert. Drank 6 beers. Next morning, woke up with same headache. Couple hours later, had blurry vision, felt like he was going to pass out. Took ibuprofen and laid down that day. Dizzy for 2 weeks after  . Headache    Had lasting headache, gradually got better last week. 06/20/19- 2nd covid shot.   . Numbness    Felt like arms/legs were going to sleep. Also noticed stiffness in arm and top of leg/hamstring.     HISTORY OF PRESENT ILLNESS:  I had the pleasure of seeing your patient, Mallory Enriques, at [facility] for neurologic consultation regarding his headaches, fatigue and left-sided numbness.  He is a 44 year old man who has had headaches and other symptoms that started after his second Covid-19 vacccination 06/20/2019.   After the vaccination he had a severe frontal headache and flu-like symptoms and dizziness.  He improved but still has issues.    He has felt very fatigued, both physically and cognitively over the summer but had no specific neurologic symptom.   Exercise became more difficult.  He went to a concert 03/13/20 with friends and did have about 6 drinks over 3 hours.   That night he had a severe headache.  He got out of bed and balance was very poor and he had visual blurring.     He took an ibuprofen and  laid down.  When he woke up, he still had a headache and dizziness.    At the end of October, he began to experience left sided numbness, leg > arm.  Balance was ok but he felt dizzy and lightheadedness.  Symptoms fluctuated.  Numbness resolved over the next week.   He continues to have a tight sensation in the left arm and leg like after a long workout.   He feels the left leg is heavier.   He is still able to jog 4 miles.without any problems and feels better afterwards.    He has had ED since mid October after the episode.   Bladder function is fine.   Vision is fine.     He still has fatigue.   He has sleep maintenance insomnia.   He does not snore.  No OSA signs.   He denies depression.   He feels he is high strung and has anxiety.    He noted reduced focus and memory over the summer but is better now.    Her mother was diagnosed with MS in 40 at age 56 but was symptomatic a few years before that.  She was in a wheelchair by the mid 1990's  CT Head 04/13/2020 was personally reviewed and is normal.   Labs performed at primary care were reviewed.  They were noncontributory.  He works as a Engineer, civil (consulting).   He tries to eat well and exercises regularly.   He takes cod liver oil, seaweed, algae, fish oil.  REVIEW OF SYSTEMS: Constitutional: No fevers, chills, sweats, or change in appetite.  He has fatigue Eyes: No visual changes, double vision, eye pain Ear, nose and throat: No hearing loss, ear pain, nasal congestion, sore throat Cardiovascular: No chest pain, palpitations Respiratory: No shortness of breath at rest or with exertion.   No wheezes GastrointestinaI: No nausea, vomiting, diarrhea, abdominal pain, fecal incontinence Genitourinary: No dysuria, urinary retention or frequency.  No nocturia. Musculoskeletal: No neck pain, back pain Integumentary: No rash, pruritus, skin lesions Neurological: as above Psychiatric: No depression at this time.  Some anxiety Endocrine: No palpitations,  diaphoresis, change in appetite, change in weigh or increased thirst Hematologic/Lymphatic: No anemia, purpura, petechiae. Allergic/Immunologic: No itchy/runny eyes, nasal congestion, recent allergic reactions, rashes  ALLERGIES: No Known Allergies  HOME MEDICATIONS:  Current Outpatient Medications:  .  ALPRAZolam (XANAX) 0.25 MG tablet, Take 1 tablet (0.25 mg total) by mouth at bedtime as needed., Disp: 30 tablet, Rfl: 0 .  Cholecalciferol (VITAMIN D) 1000 UNITS capsule, Take 2,000 Units by mouth daily., Disp: , Rfl:  .  flunisolide (NASAREL) 29 MCG/ACT (0.025%) nasal spray, Place 2 sprays into the nose 2 (two) times daily. Dose is for each nostril., Disp: 25 mL, Rfl: 3 .  lisinopril (ZESTRIL) 20 MG tablet, Take 20 mg by mouth daily., Disp: , Rfl:  .  loratadine (CLARITIN) 10 MG tablet, Take 10 mg by mouth as needed.  , Disp: , Rfl:  .  Multiple Vitamin (MULTIVITAMIN) tablet, Take 1 tablet by mouth daily., Disp: , Rfl:  .  Pramoxine HCl (PROCTOFOAM RE), Place rectally as needed.  , Disp: , Rfl:   PAST MEDICAL HISTORY: Past Medical History:  Diagnosis Date  . Allergy   . Fainting spell   . Giardia 2009  . Hypertension     PAST SURGICAL HISTORY: Past Surgical History:  Procedure Laterality Date  . SPERMATOCELECTOMY  2000   Dr Isabel Caprice  . TONSILLECTOMY  1992   Done in [place]    FAMILY HISTORY: Family History  Problem Relation Age of Onset  . Hypertension Mother   . Hyperlipidemia Father   . Hypertension Father   . Gout Father   . Cancer Maternal Grandfather        lung  . Stroke Maternal Grandmother   . Hypertension Maternal Grandmother   . Arthritis Paternal Grandmother        rheumatoid  . Arthritis Paternal Grandfather        rheumatoid  . Gout Paternal Grandfather     SOCIAL HISTORY:  Social History   Socioeconomic History  . Marital status: Single    Spouse name: Not on file  . Number of children: 0  . Years of education: Not on file  . Highest  education level: Not on file  Occupational History  . 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 Smoking status: Former Smoker    Years: 10.00  . Smokeless tobacco: Never Used  Substance and Sexual Activity  . Alcohol use: Yes    Alcohol/week: 6.0 standard drinks    Types: 6 Cans of beer per week    Comment: on weekends  . Drug use: No  . Sexual activity: Not on file  Other Topics Concern  . Not on file  Social History Narrative   Lives   Regular Exercise: 6 days weekly   Caffeine use:  4 cups coffee daily   Works in cath lab.  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       Social Determinants of Health   Financial Resource Strain:   . Difficulty of Paying Living Expenses: Not on file  Food Insecurity:   . Worried About Programme researcher, broadcasting/film/videounning Out of Food in the Last Year: Not on file  . Ran Out of Food in the Last Year: Not on file  Transportation Needs:   . Lack of Transportation (Medical): Not on file  . Lack of Transportation (Non-Medical): Not on file  Physical Activity:   . Days of Exercise per Week: Not on file  . Minutes of Exercise per Session: Not on file  Stress:   . Feeling of Stress : Not on file  Social Connections:   . Frequency of Communication with Friends and Family: Not on file  . Frequency of Social Gatherings with Friends and Family: Not on file  . Attends Religious Services: Not on file  . Active Member of Clubs or Organizations: Not on file  . Attends BankerClub or Organization Meetings: Not on file  . Marital Status: Not on file  Intimate Partner Violence:   . Fear of Current or Ex-Partner: Not on file  . Emotionally Abused: Not on file  . Physically Abused: Not on file  . Sexually Abused: Not on file     PHYSICAL EXAM  Vitals:   05/04/20 1120  BP: (!) 145/94  Pulse: 64  Weight: 182 lb (82.6 kg)  Height: 5' 4.5" (1.638 m)    Body mass  index is 30.76 kg/m.   General: The patient is well-developed and well-nourished and in no acute distress  HEENT:  Head is [place]/AT.  Sclera are anicteric.  Funduscopic exam shows normal optic discs and retinal vessels.  Neck: No carotid bruits are noted.  The neck is nontender.  Cardiovascular: The heart has a regular rate and rhythm with a normal S1 and S2. There were no murmurs, gallops or rubs.    Skin: Extremities are without rash or  edema.  Musculoskeletal:  Back is nontender  Neurologic Exam  Mental status: The patient is alert and oriented x 3 at the time of the examination. The patient has apparent normal recent and remote memory, with an apparently normal attention span and concentration ability.   Speech is normal.  Cranial nerves: Extraocular movements are full. Pupils are equal, round, and reactive to light and accomodation.  Visual fields are full.  Facial symmetry is present. There is good facial sensation to soft touch bilaterally.Facial strength is normal.  Trapezius and sternocleidomastoid strength is normal. No dysarthria is noted.  The tongue is midline, and the patient has symmetric elevation of the soft palate. No obvious hearing deficits are noted.  Motor:  Muscle bulk is normal.   Tone is normal. Strength is  5 / 5 in all 4 extremities.   Sensory: Sensory testing is intact to pinprick, soft touch and vibration sensation in all 4 extremities.  Coordination: Cerebellar testing reveals good finger-nose-finger and heel-to-shin bilaterally.  Gait and station: Station is normal.   Gait is normal. Tandem gait is normal. Romberg is negative.   Reflexes: Deep tendon reflexes are symmetric and normal bilaterally.   Plantar responses are flexor.      ASSESSMENT AND PLAN  Numbness - Plan: MR BRAIN W WO CONTRAST, MR CERVICAL SPINE W WO CONTRAST, Vitamin B12  Nonintractable headache, unspecified chronicity pattern, unspecified  headache type - Plan: MR BRAIN W WO  CONTRAST  Other fatigue - Plan: VITAMIN D 25 Hydroxy (Vit-D Deficiency, Fractures), Vitamin B12   In summary, Mr. Mcduffee is a 44 year old man who has had multiple symptoms over the last year initially with headaches after his 2nd COVID-19 vaccination.  He also had fatigue which persisted.  More recently, in October, he had additional symptoms, initially with headaches and blurry vision and then a couple weeks later with left-sided numbness and a heavy sensation and ED.  Of note, he has a family history of MS (mother)  Because of his symptoms, especially the left-sided arm and leg numbness and heavy sensation, I am concerned about the possibility of MS or other process that could involve the cervical spinal cord.  Therefore, we need to check an MRI of the cervical spine with and without contrast.  Additionally, because of the headaches and visual changes in two further assess for the possibility of MS or other process we will check an MRI of the brain.  Fatigue has persisted and we will check vitamin B12 and vitamin D.  We will let him know the results of the studies and he will follow up in 3 months but call sooner if he has new or worsening neurologic symptoms.  Thank you for asking me to see Mr. Olmeda.  Please let me know if I can be of further assistance with him or other patients in the future.     Giannamarie Paulus A. Epimenio Foot, MD, [facility] 05/04/2020, 11:52 AM Certified in Neurology, Clinical Neurophysiology, Sleep Medicine and Neuroimaging  [facility] [address] [place], [place] [address] [phone]

## 2020-05-05 ENCOUNTER — Telehealth: Payer: Self-pay | Admitting: Neurology

## 2020-05-05 LAB — VITAMIN D 25 HYDROXY (VIT D DEFICIENCY, FRACTURES): Vit D, 25-Hydroxy: 40.6 ng/mL (ref 30.0–100.0)

## 2020-05-05 LAB — VITAMIN B12: Vitamin B-12: 1037 pg/mL (ref 232–1245)

## 2020-05-05 NOTE — Telephone Encounter (Signed)
LVM for pt to call back EE  cone umr auth: NPR Ref # [id number]

## 2020-05-05 NOTE — Telephone Encounter (Signed)
Patient returned my call he is scheduled at [facility] for 05/11/20.

## 2020-05-11 ENCOUNTER — Ambulatory Visit: Payer: 59

## 2020-05-11 ENCOUNTER — Other Ambulatory Visit: Payer: Self-pay

## 2020-05-11 DIAGNOSIS — R519 Headache, unspecified: Secondary | ICD-10-CM | POA: Diagnosis not present

## 2020-05-11 DIAGNOSIS — R200 Anesthesia of skin: Secondary | ICD-10-CM

## 2020-05-11 MED ORDER — GADOBENATE DIMEGLUMINE 529 MG/ML IV SOLN
15.0000 mL | Freq: Once | INTRAVENOUS | Status: AC | PRN
  Administered 2020-05-11: 15 mL via INTRAVENOUS

## 2020-05-14 ENCOUNTER — Telehealth: Payer: Self-pay | Admitting: Neurology

## 2020-05-14 NOTE — Telephone Encounter (Signed)
I tried to call to go over the results of the imaging studies performed recently.  It went to voicemail so I left a message that I will try to call again later.  The MRI of the cervical spine was essentially normal.  The MRI of the brain did show minimal chronic microvascular ischemic changes.  The extent is very mild and his main risk factor would be hypertension.  It is important for him to keep the blood pressure under good control.  The few small spots are unlikely to cause symptoms.

## 2020-05-18 NOTE — Telephone Encounter (Signed)
I called and got voicemail again.  I sent a MyChart message that the MRI looks okay.  He needs to make sure he keeps the blood pressure under control.  The spots do not look like MS.

## 2020-05-18 NOTE — Telephone Encounter (Signed)
I spoke to the patient to go over the MRI.  Details earlier in note.

## 2020-05-18 NOTE — Telephone Encounter (Signed)
I read Dr.'s message to pt. & he stated this information makes him feel better. He also asked if Dr. could give him a call back for further questions.

## 2020-06-10 MED FILL — LISINOPRIL 20 MG TABS: 20 | 90 days supply | Qty: 90 | Fill #2

## 2020-06-15 ENCOUNTER — Other Ambulatory Visit (HOSPITAL_COMMUNITY): Payer: Self-pay | Admitting: Internal Medicine

## 2020-06-15 DIAGNOSIS — M2550 Pain in unspecified joint: Secondary | ICD-10-CM | POA: Diagnosis not present

## 2020-06-15 DIAGNOSIS — R42 Dizziness and giddiness: Secondary | ICD-10-CM | POA: Diagnosis not present

## 2020-06-15 DIAGNOSIS — R0602 Shortness of breath: Secondary | ICD-10-CM | POA: Diagnosis not present

## 2020-06-15 DIAGNOSIS — M791 Myalgia, unspecified site: Secondary | ICD-10-CM | POA: Diagnosis not present

## 2020-06-15 DIAGNOSIS — R002 Palpitations: Secondary | ICD-10-CM | POA: Diagnosis not present

## 2020-06-15 MED FILL — ALPRAZolam 0.25 MG TABS: 0.25 | 30 days supply | Qty: 30 | Fill #0

## 2020-06-17 DIAGNOSIS — R002 Palpitations: Secondary | ICD-10-CM | POA: Diagnosis not present

## 2020-06-17 DIAGNOSIS — R0602 Shortness of breath: Secondary | ICD-10-CM | POA: Diagnosis not present

## 2020-06-18 ENCOUNTER — Other Ambulatory Visit: Payer: Self-pay | Admitting: Neurology

## 2020-06-18 MED ORDER — METHOCARBAMOL 500 MG PO TABS: 500.0000 mg | ORAL_TABLET | Freq: Three times a day (TID) | ORAL | 1 refills | Status: DC | PRN

## 2020-06-18 MED FILL — METHOCARBAMOL 500 MG TABS: 500 | 30 days supply | Qty: 90 | Fill #0

## 2020-07-05 DIAGNOSIS — R002 Palpitations: Secondary | ICD-10-CM | POA: Diagnosis not present

## 2020-07-09 DIAGNOSIS — R002 Palpitations: Secondary | ICD-10-CM | POA: Diagnosis not present

## 2020-09-06 ENCOUNTER — Other Ambulatory Visit (HOSPITAL_COMMUNITY): Payer: Self-pay

## 2020-09-06 ENCOUNTER — Telehealth: Payer: 59 | Admitting: Emergency Medicine

## 2020-09-06 DIAGNOSIS — H00014 Hordeolum externum left upper eyelid: Secondary | ICD-10-CM | POA: Diagnosis not present

## 2020-09-06 MED ORDER — ERYTHROMYCIN 5 MG/GM OP OINT
1.0000 "application " | TOPICAL_OINTMENT | Freq: Every day | OPHTHALMIC | 0 refills | Status: DC
  Filled 2020-09-06: qty 3.5, 7d supply, fill #0

## 2020-09-06 NOTE — Progress Notes (Signed)
  E-Visit for Stye   We are sorry that you are not feeling well. Here is how we plan to help!  Based on what you have shared with me it looks like you have a stye.  A stye is an inflammation of the eyelid.  It is often a red, painful lump near the edge of the eyelid that may look like a boil or a pimple.  A stye develops when an infection occurs at the base of an eyelash.   We have made appropriate suggestions for you based upon your presentation: Simple styes can be treated without medical intervention.  Most styes either resolve spontaneously or resolve with simple home treatment by applying warm compresses or heated washcloth to the stye for about 10-15 minutes three to four times a day. This causes the stye to drain and resolve.  HOME CARE:   Wash your hands often!  Let the stye open on its own. Don't squeeze or open it.  Don't rub your eyes. This can irritate your eyes and let in bacteria.  If you need to touch your eyes, wash your hands first.  Don't wear eye makeup or contact lenses until the area has healed.  GET HELP RIGHT AWAY IF:   Your symptoms do not improve.  You develop blurred or loss of vision.  Your symptoms worsen (increased discharge, pain or redness).  Thank you for choosing an e-visit.  Your e-visit answers were reviewed by a board certified advanced clinical practitioner to complete your personal care plan.  Depending upon the condition, your plan could have included both over the counter or prescription medications.  Please review your pharmacy choice.  Make sure the pharmacy is open so you can pick up prescription now.  If there is a problem, you may contact your provider through [employer] and have the prescription routed to another pharmacy.    Your safety is important to [place].  If you have drug allergies check your prescription carefully.  For the next 24 hours you can use MyChart to ask questions about today's visit, request a non-urgent call  back, or ask for a work or school excuse.  You will get an email in the next two days asking about your experience.  I hope you that your e-visit has been valuable and will speed your recovery.   Approximately 5 minutes was spent documenting and reviewing patient's chart.

## 2020-09-06 NOTE — Addendum Note (Signed)
Addended by: Margaretann Loveless on: 09/06/2020 02:44 PM   Modules accepted: Orders

## 2020-09-07 ENCOUNTER — Ambulatory Visit: Payer: 59 | Admitting: Neurology

## 2020-09-08 DIAGNOSIS — H00014 Hordeolum externum left upper eyelid: Secondary | ICD-10-CM | POA: Diagnosis not present

## 2020-09-09 ENCOUNTER — Other Ambulatory Visit (HOSPITAL_COMMUNITY): Payer: Self-pay

## 2020-09-09 MED ORDER — CEPHALEXIN 500 MG PO CAPS
500.0000 mg | ORAL_CAPSULE | Freq: Two times a day (BID) | ORAL | 0 refills | Status: DC
  Filled 2020-09-09: qty 24, 12d supply, fill #0

## 2020-09-21 DIAGNOSIS — Z1321 Encounter for screening for nutritional disorder: Secondary | ICD-10-CM | POA: Diagnosis not present

## 2020-09-21 DIAGNOSIS — M791 Myalgia, unspecified site: Secondary | ICD-10-CM | POA: Diagnosis not present

## 2020-09-21 DIAGNOSIS — R5383 Other fatigue: Secondary | ICD-10-CM | POA: Diagnosis not present

## 2020-09-24 DIAGNOSIS — M791 Myalgia, unspecified site: Secondary | ICD-10-CM | POA: Diagnosis not present

## 2020-09-24 DIAGNOSIS — Z1321 Encounter for screening for nutritional disorder: Secondary | ICD-10-CM | POA: Diagnosis not present

## 2020-09-24 DIAGNOSIS — R5383 Other fatigue: Secondary | ICD-10-CM | POA: Diagnosis not present

## 2020-09-24 MED FILL — Lisinopril Tab 20 MG: ORAL | 90 days supply | Qty: 90 | Fill #0 | Status: AC

## 2020-09-25 ENCOUNTER — Other Ambulatory Visit (HOSPITAL_COMMUNITY): Payer: Self-pay

## 2020-10-11 DIAGNOSIS — Z0000 Encounter for general adult medical examination without abnormal findings: Secondary | ICD-10-CM | POA: Diagnosis not present

## 2020-10-11 DIAGNOSIS — Z125 Encounter for screening for malignant neoplasm of prostate: Secondary | ICD-10-CM | POA: Diagnosis not present

## 2020-10-18 ENCOUNTER — Other Ambulatory Visit (HOSPITAL_COMMUNITY): Payer: Self-pay

## 2020-10-18 DIAGNOSIS — R002 Palpitations: Secondary | ICD-10-CM | POA: Diagnosis not present

## 2020-10-18 DIAGNOSIS — E782 Mixed hyperlipidemia: Secondary | ICD-10-CM | POA: Diagnosis not present

## 2020-10-18 DIAGNOSIS — Z0000 Encounter for general adult medical examination without abnormal findings: Secondary | ICD-10-CM | POA: Diagnosis not present

## 2020-10-18 DIAGNOSIS — Z125 Encounter for screening for malignant neoplasm of prostate: Secondary | ICD-10-CM | POA: Diagnosis not present

## 2020-10-18 DIAGNOSIS — I10 Essential (primary) hypertension: Secondary | ICD-10-CM | POA: Diagnosis not present

## 2020-10-18 DIAGNOSIS — F419 Anxiety disorder, unspecified: Secondary | ICD-10-CM | POA: Diagnosis not present

## 2020-10-18 MED ORDER — HYDROXYZINE HCL 25 MG PO TABS
25.0000 mg | ORAL_TABLET | Freq: Every day | ORAL | 3 refills | Status: DC
  Filled 2020-10-18: qty 90, 30d supply, fill #0
  Filled 2021-01-06: qty 90, 30d supply, fill #1
  Filled 2021-03-30: qty 90, 30d supply, fill #2

## 2020-10-19 ENCOUNTER — Other Ambulatory Visit (HOSPITAL_COMMUNITY): Payer: Self-pay

## 2020-10-19 MED ORDER — ALPRAZOLAM 0.25 MG PO TABS
0.2500 mg | ORAL_TABLET | Freq: Every evening | ORAL | 1 refills | Status: DC | PRN
  Filled 2020-10-19: qty 30, 30d supply, fill #0
  Filled 2021-03-30: qty 30, 30d supply, fill #1

## 2020-10-20 ENCOUNTER — Other Ambulatory Visit (HOSPITAL_COMMUNITY): Payer: Self-pay

## 2020-10-20 MED ORDER — CLOBETASOL PROPIONATE 0.05 % EX CREA
TOPICAL_CREAM | Freq: Two times a day (BID) | CUTANEOUS | 6 refills | Status: DC
  Filled 2020-10-20: qty 15, 7d supply, fill #0
  Filled 2020-10-20: qty 45, 30d supply, fill #0

## 2020-10-20 MED ORDER — LISINOPRIL 20 MG PO TABS
20.0000 mg | ORAL_TABLET | Freq: Every day | ORAL | 4 refills | Status: DC
  Filled 2020-10-20 – 2021-01-07 (×2): qty 90, 90d supply, fill #0
  Filled 2021-03-30: qty 90, 90d supply, fill #1
  Filled 2021-07-04: qty 90, 90d supply, fill #2
  Filled 2021-09-13 – 2021-09-14 (×2): qty 90, 90d supply, fill #3

## 2021-01-06 MED FILL — Methocarbamol Tab 500 MG: ORAL | 30 days supply | Qty: 90 | Fill #0 | Status: CN

## 2021-01-07 ENCOUNTER — Other Ambulatory Visit (HOSPITAL_COMMUNITY): Payer: Self-pay

## 2021-01-07 MED FILL — Methocarbamol Tab 500 MG: ORAL | 30 days supply | Qty: 90 | Fill #0 | Status: AC

## 2021-01-08 ENCOUNTER — Other Ambulatory Visit (HOSPITAL_COMMUNITY): Payer: Self-pay

## 2021-01-30 IMAGING — CT CT HEAD W/O CM
1 series · 16 of 30 positions shown, 20 images · non-contrast
Comparison: Report of 01/24/2001

CLINICAL DATA: Headache and blurred vision 2 weeks ago. Dizziness
and left arm tingling. Syncopal episodes as a teenager.

EXAM:
CT HEAD WITHOUT CONTRAST
TECHNIQUE: Contiguous axial images were obtained from the base of the skull
through the vertex without intravenous contrast.

[Series 2: head w/(date) · axial · 0.49mm/px · z∈[-194,-40]mm · 16 of 35 slices shown, 20 images]
[im 2/35  brain]
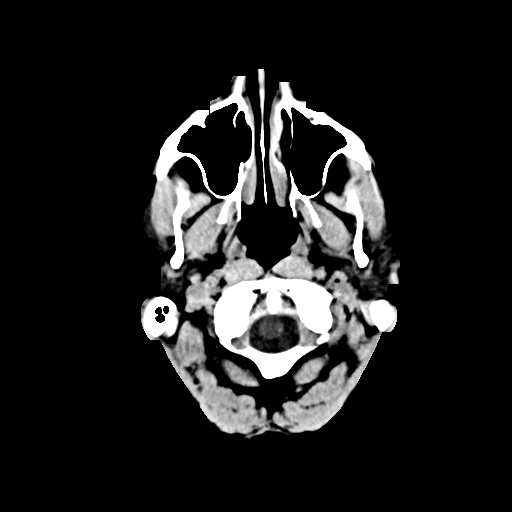
[im 2/35  bone]
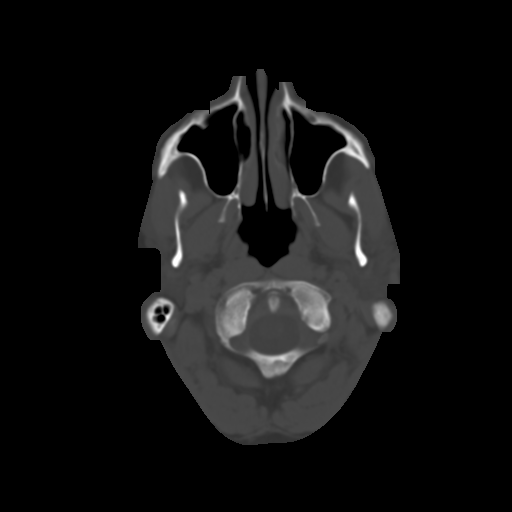
[im 4/35  brain]
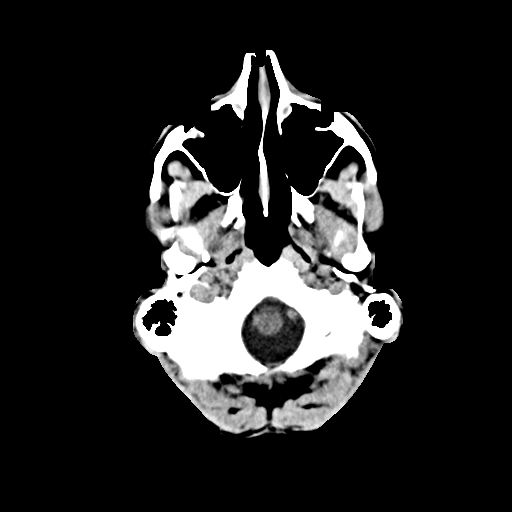
[im 6/35  brain]
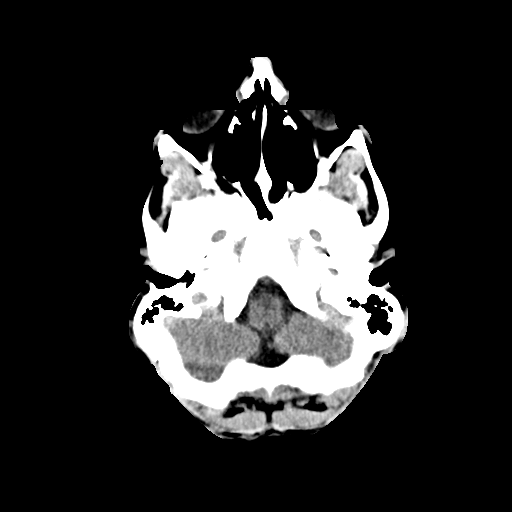
[im 9/35  brain]
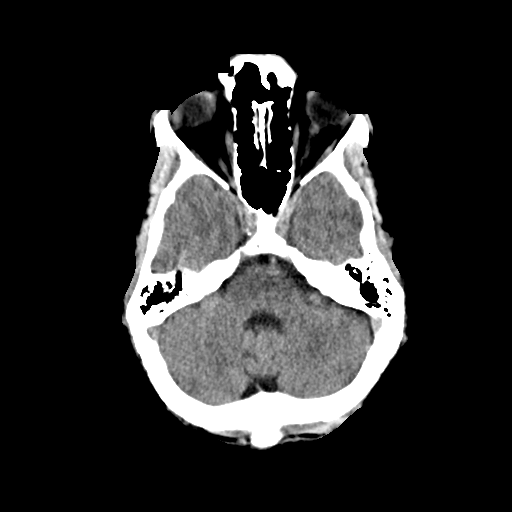
[im 10/35  brain]
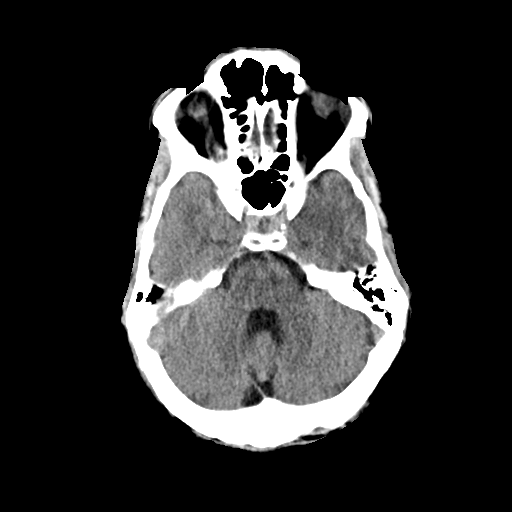
[im 10/35  bone]
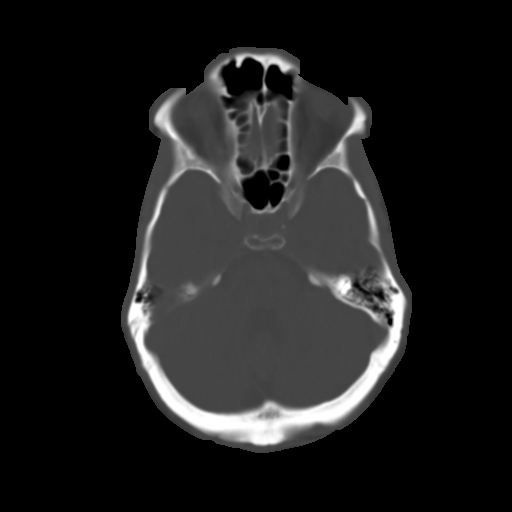
[im 12/35  brain]
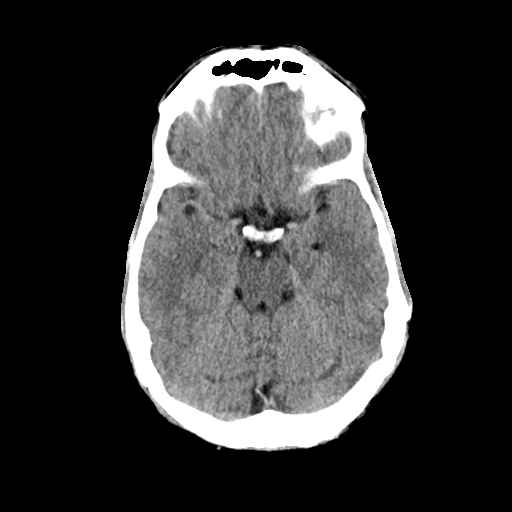
[im 15/35  brain]
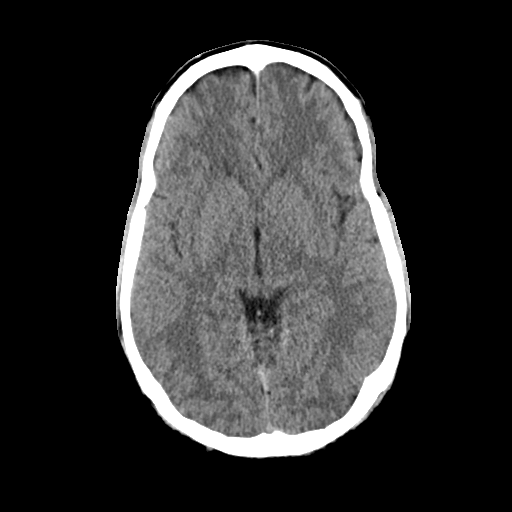
[im 17/35  brain]
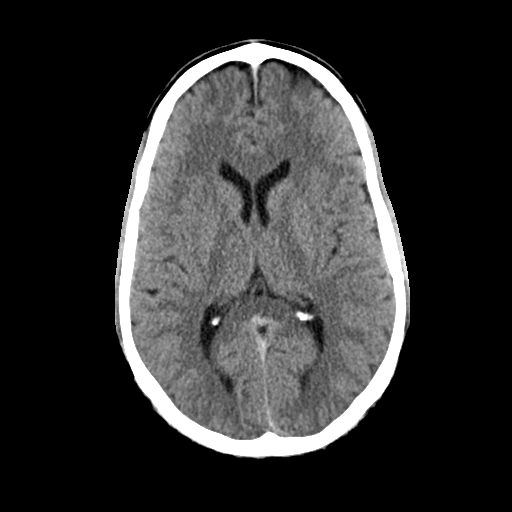
[im 18/35  brain]
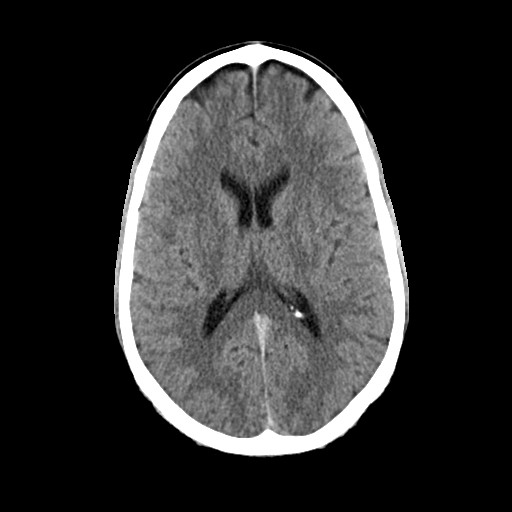
[im 18/35  bone]
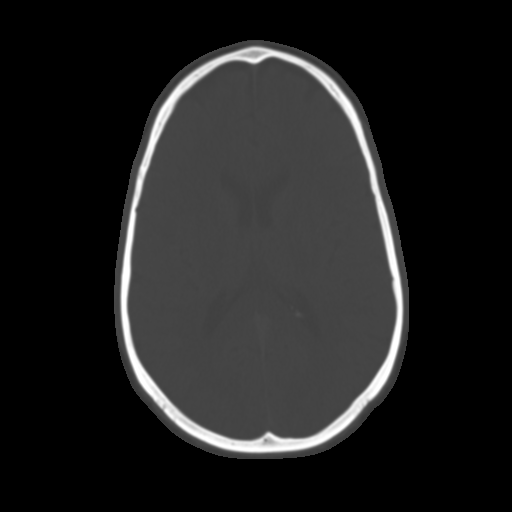
[im 20/35  brain]
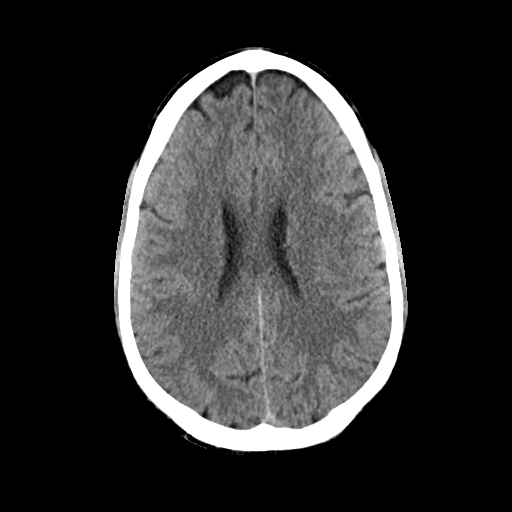
[im 23/35  brain]
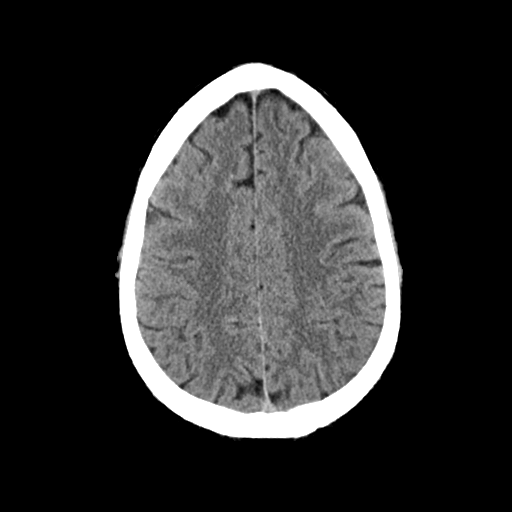
[im 25/35  brain]
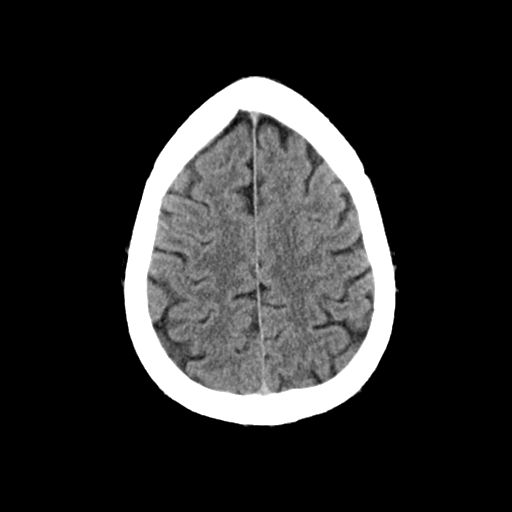
[im 26/35  brain]
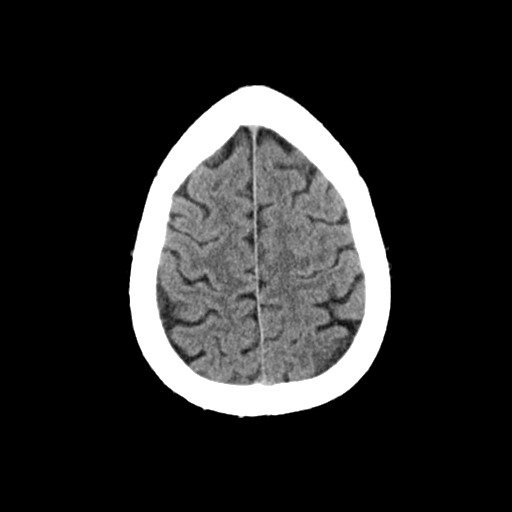
[im 26/35  bone]
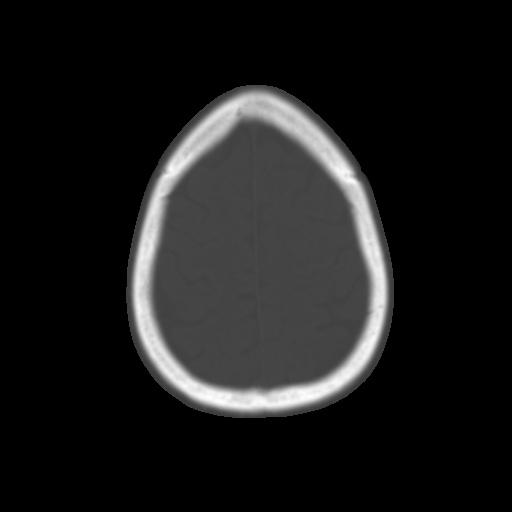
[im 29/35  brain]
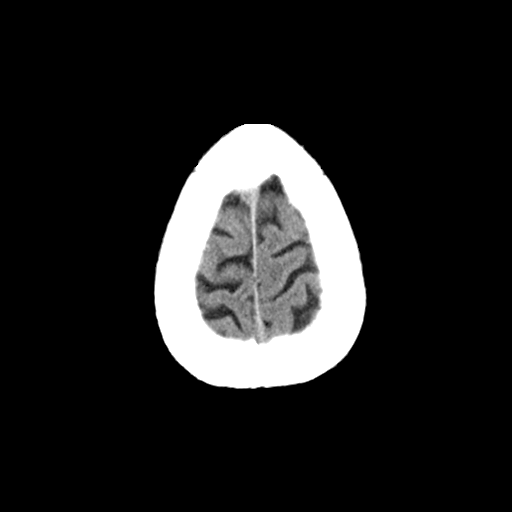
[im 31/35  brain]
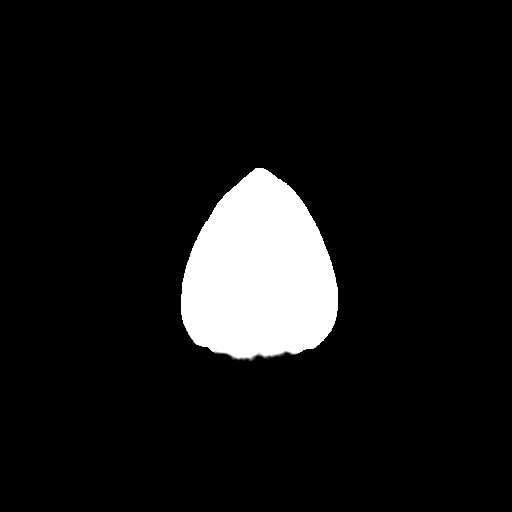
[im 33/35  brain]
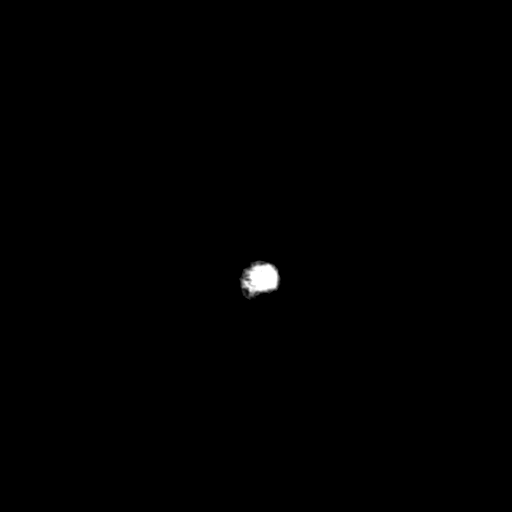

[16 of 30 positions shown; findings below may reference images not displayed]

FINDINGS: Brain: No mass lesion, hemorrhage, hydrocephalus, acute infarct,
intra-axial, or extra-axial fluid collection.

Vascular: No hyperdense vessel or unexpected calcification.

Skull: Normal skull.

Sinuses/Orbits: Normal imaged portions of the orbits and globes.
Clear paranasal sinuses and mastoid air cells.

Other: None.
IMPRESSION: Normal head CT.

## 2021-03-31 ENCOUNTER — Other Ambulatory Visit (HOSPITAL_COMMUNITY): Payer: Self-pay

## 2021-06-15 ENCOUNTER — Ambulatory Visit: Payer: No Typology Code available for payment source | Admitting: Neurology

## 2021-06-15 ENCOUNTER — Other Ambulatory Visit: Payer: Self-pay

## 2021-06-15 ENCOUNTER — Other Ambulatory Visit (HOSPITAL_COMMUNITY): Payer: Self-pay

## 2021-06-15 ENCOUNTER — Encounter: Payer: Self-pay | Admitting: Neurology

## 2021-06-15 VITALS — BP 138/90 | HR 61 | Ht 69.0 in | Wt 180.0 lb

## 2021-06-15 DIAGNOSIS — M6289 Other specified disorders of muscle: Secondary | ICD-10-CM | POA: Diagnosis not present

## 2021-06-15 DIAGNOSIS — G9332 Myalgic encephalomyelitis/chronic fatigue syndrome: Secondary | ICD-10-CM | POA: Diagnosis not present

## 2021-06-15 DIAGNOSIS — F32A Depression, unspecified: Secondary | ICD-10-CM

## 2021-06-15 DIAGNOSIS — R5382 Chronic fatigue, unspecified: Secondary | ICD-10-CM

## 2021-06-15 DIAGNOSIS — R5381 Other malaise: Secondary | ICD-10-CM

## 2021-06-15 MED ORDER — BUPROPION HCL ER (XL) 150 MG PO TB24
150.0000 mg | ORAL_TABLET | Freq: Every day | ORAL | 3 refills | Status: DC
  Filled 2021-06-15: qty 90, 90d supply, fill #0
  Filled 2021-09-11: qty 90, 90d supply, fill #1

## 2021-06-15 NOTE — Progress Notes (Signed)
[facility]  PATIENT: William Dorsey E Kassin DOB: 1975/06/27  REFERRING DOCTOR OR PCP: Dr. Nicholos Johnsamachandran. SOURCE: Patient, notes from primary care, imaging and lab reports, CT scan personally reviewed.  _________________________________   HISTORICAL  CHIEF COMPLAINT:  Chief Complaint  Patient presents with   Follow-up    Pt alone, rm 2,  overall worsening symptoms. Extremely fatigued along with generalized weakness. States that it takes effort at times to even stand. Unable to do the heavy working out that he is used to. States on weekends he can sleep all weekend which is not normal for him. He has never had a SS    HISTORY OF PRESENT ILLNESS:  William Dorsey is a 46 y.o. man with headaches, fatigue and left-sided numbness.  Update 06/15/2021 He reports feeling worse in general.  Specifically he reports extreme fatigue and he reports exercise intolerance anfd that he feels heavy.  Even standing up seems to take more effort.   He feels better in the mornings but very faigued in the afternoons.   To try to keep in shape.  He walks 2 miles every afternoon/evening.   He feels exhausted doing exercises that were easy (can only do 20 pushups now).    He also has altered sensation.  Tight shoes are very uncomfortable.  He just rests on weekends now and does feel better on Mondays.     He denies diplopi or eyelid drooping, even when tired.    At work, he feel he has trouble with his memory.  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    He has trouble calculating doses.    He now is not having headaches.  He had a lot October 2021- early 2022.    He has some left lower leg numbness and left foot numbness but this is rare.   This is better  He had a run of AFib January 2022 and had extensive labwork.      He sleeps well at night 8-9 hours on weekdays and up to 12 hours on weekends.  He does not snore.    Although he is tired, he is not sleepy and does not doze off.     He  notes being more irritable than he used to be.   He was typically laid back and comical.    He takes lisinopril, MVI, CoQ10.   He only rarely takes alprazolam, hydroxyzine or methocarbamol --- none in last couple month  History of symptoms:   Symptoms started  after his second Covid-19 vacccination 06/20/2019.   After the vaccination he had a severe frontal headache and flu-like symptoms and dizziness.  He improved but still has issues.    He has felt very fatigued, both physically and cognitively over the summer but had no specific neurologic symptom.   Exercise became more difficult.  At the end of October 2021, he began to experience left sided numbness, leg > arm.  Balance was ok but he felt dizzy and lightheadedness.  Symptoms fluctuated.  Numbness resolved over the next week.   He continues to have a tight sensation in the left arm and leg like after a long workout.   He feels the left leg is heavier.   He is still able to jog 4 miles.without any problems and feels better afterwards.    He has had ED since mid October after the episode.   Bladder function is fine.   Vision is fine.     He still  has fatigue.   He has sleep maintenance insomnia.   He does not snore.  No OSA signs.   He denies depression.   He feels he is high strung and has anxiety.    He noted reduced focus and memory over the summer but is better now.    Her mother was diagnosed with MS in 60 at age 29 but was symptomatic a few years before that.  She was in a wheelchair by the mid 1990's  CT Head 04/13/2020 was personally reviewed and is normal.   Labs performed at primary care were reviewed.  They were noncontributory.  He works as a Marine scientist.   He tries to eat well and exercises regularly.   He takes cod liver oil, seaweed, algae, fish oil.     Imaging  MRI of the brain 05/11/2020 showed a few punctate T2/FLAIR hyperintense foci in the subcortical and deep white matter of the cerebral hemispheres.  This is a nonspecific  finding and could be incidental or due to very minimal chronic microvascular ischemic change or the sequela of migraine headache.      There were no acute findings.  There is a normal enhancement pattern.  MRI of the cervical spine 05/11/2020 showed a normal spinal cord and mild degenerative changes that did not lead to spinal stenosis or nerve root compression.  REVIEW OF SYSTEMS: Constitutional: No fevers, chills, sweats, or change in appetite.  He has fatigue Eyes: No visual changes, double vision, eye pain Ear, nose and throat: No hearing loss, ear pain, nasal congestion, sore throat Cardiovascular: No chest pain, palpitations Respiratory:  No shortness of breath at rest or with exertion.   No wheezes GastrointestinaI: No nausea, vomiting, diarrhea, abdominal pain, fecal incontinence Genitourinary:  No dysuria, urinary retention or frequency.  No nocturia. Musculoskeletal:  No neck pain, back pain Integumentary: No rash, pruritus, skin lesions Neurological: as above Psychiatric: No depression at this time.  Some anxiety Endocrine: No palpitations, diaphoresis, change in appetite, change in weigh or increased thirst Hematologic/Lymphatic:  No anemia, purpura, petechiae. Allergic/Immunologic: No itchy/runny eyes, nasal congestion, recent allergic reactions, rashes  ALLERGIES: No Known Allergies  HOME MEDICATIONS:  Current Outpatient Medications:    buPROPion (WELLBUTRIN XL) 150 MG 24 hr tablet, Take 1 tablet (150 mg total) by mouth daily., Disp: 90 tablet, Rfl: 3   Cholecalciferol (VITAMIN D) 1000 UNITS capsule, Take 2,000 Units by mouth daily., Disp: , Rfl:    clobetasol cream (TEMOVATE) 0.05 %, Apply topically to the affected area 2 times daily as needed, Disp: 45 g, Rfl: 6   erythromycin ophthalmic ointment, Place 1 application into the left eye at bedtime., Disp: 3.5 g, Rfl: 0   flunisolide (NASAREL) 29 MCG/ACT (0.025%) nasal spray, Place 2 sprays into the nose 2 (two) times  daily. Dose is for each nostril., Disp: 25 mL, Rfl: 3   lisinopril (ZESTRIL) 20 MG tablet, Take 1 tablet (20 mg total) by mouth daily., Disp: 90 tablet, Rfl: 4   loratadine (CLARITIN) 10 MG tablet, Take 10 mg by mouth as needed.  , Disp: , Rfl:    Multiple Vitamin (MULTIVITAMIN) tablet, Take 1 tablet by mouth daily., Disp: , Rfl:    Pramoxine HCl (PROCTOFOAM RE), Place rectally as needed.  , Disp: , Rfl:   PAST MEDICAL HISTORY: Past Medical History:  Diagnosis Date   Allergy    Fainting spell    Giardia 2009   Hypertension     PAST SURGICAL HISTORY: Past Surgical History:  Procedure Laterality Date   SPERMATOCELECTOMY  2000   Dr Risa Grill   TONSILLECTOMY  1992   Done in [place]    FAMILY HISTORY: Family History  Problem Relation Age of Onset   Hypertension Mother    Hyperlipidemia Father    Hypertension Father    Gout Father    Cancer Maternal Grandfather        lung   Stroke Maternal Grandmother    Hypertension Maternal Grandmother    Arthritis Paternal Grandmother        rheumatoid   Arthritis Paternal Grandfather        rheumatoid   Gout Paternal Grandfather     SOCIAL HISTORY:  Social History   Socioeconomic History   Marital status: Single    Spouse name: Not on file   Number of children: 0   Years of education: Not on file   Highest education level: Not on file  Occupational History   Occupation: Marine scientist in Statistician Lab at [employer]    Employer: [employer]  Tobacco Use   Smoking status: Former    Years: 10.00    Types: Cigarettes   Smokeless tobacco: Never  Substance and Sexual Activity   Alcohol use: Yes    Alcohol/week: 6.0 standard drinks    Types: 6 Cans of beer per week    Comment: on weekends   Drug use: No   Sexual activity: Not on file  Other Topics Concern   Not on file  Social History Narrative   Lives   Regular Exercise: 6 days weekly   Caffeine use:  4 cups coffee daily   Works in cath lab.     3 dogs (2 schnauzers and a terrier pound  dog)   Has a partner who lives in [place]- he works in the Arts development officer.       Social Determinants of Health   Financial Resource Strain: Not on file  Food Insecurity: Not on file  Transportation Needs: Not on file  Physical Activity: Not on file  Stress: Not on file  Social Connections: Not on file  Intimate Partner Violence: Not on file     PHYSICAL EXAM  Vitals:   06/15/21 0821  BP: 138/90  Pulse: 61  Weight: 180 lb (81.6 kg)  Height: 5\' 9"  (1.753 m)    Body mass index is 26.58 kg/m.   General: The patient is well-developed and well-nourished and in no acute distress  HEENT:  Head is [place]/AT.  Sclera are anicteric.  Funduscopic exam shows normal optic discs and retinal vessels.  Neck/back/musculoskeletal:  The neck is nontender.  No FMS tender point tenderness  Skin: Extremities are without rash or  edema.    Neurologic Exam  Mental status: The patient is alert and oriented x 3 at the time of the examination. The patient has apparent normal recent and remote memory, with an apparently normal attention span and concentration ability.   Speech is normal.  Cranial nerves: Extraocular movements are full. Normal facial strength.   No ptosis.   No obvious hearing deficits are noted.  Motor:  Muscle bulk is normal.   Tone is normal. Strength is  5 / 5 in all 4 extremities.   Sensory: Sensory testing is intact to pinprick, soft touch and vibration sensation in all 4 extremities.  Coordination: Cerebellar testing reveals good finger-nose-finger and heel-to-shin bilaterally.  Gait and station: Station is normal.   Gait is normal. Tandem gait is normal. Romberg is negative.   Reflexes:  Deep tendon reflexes are symmetric and normal bilaterally.          ASSESSMENT AND PLAN  Chronic fatigue and malaise - Plan: CK, Thyroid Panel With TSH, Acetylcholine receptor, binding, Sedimentation rate, C-reactive protein, CBC with Differential/Platelet, Comprehensive metabolic  panel, Magnesium  Chronic fatigue syndrome  Muscle fatigue - Plan: CK, Thyroid Panel With TSH, Acetylcholine receptor, binding, Sedimentation rate, C-reactive protein, CBC with Differential/Platelet, Comprehensive metabolic panel, Magnesium   Unclear etiology of fatigue/malaise.    He also could have a mild depression 1..  Labwork for muscle fatigue 2.  Trial of Wellbutrin 3.   Rtc 6 months     Shuntel Fishburn A. Felecia Shelling, MD, [facility] [id number], [id number] AM Certified in Neurology, Clinical Neurophysiology, Sleep Medicine and Neuroimaging  [facility] [address] [place], [place] [address] [phone]

## 2021-06-16 LAB — CBC WITH DIFFERENTIAL/PLATELET
Basophils Absolute: 0 10*3/uL (ref 0.0–0.2)
Basos: 1 %
EOS (ABSOLUTE): 0.1 10*3/uL (ref 0.0–0.4)
Eos: 1 %
Hematocrit: 49.5 % (ref 37.5–51.0)
Hemoglobin: 17.5 g/dL (ref 13.0–17.7)
Immature Grans (Abs): 0 10*3/uL (ref 0.0–0.1)
Immature Granulocytes: 0 %
Lymphocytes Absolute: 1.7 10*3/uL (ref 0.7–3.1)
Lymphs: 37 %
MCH: 31.7 pg (ref 26.6–33.0)
MCHC: 35.4 g/dL (ref 31.5–35.7)
MCV: 90 fL (ref 79–97)
Monocytes Absolute: 0.3 10*3/uL (ref 0.1–0.9)
Monocytes: 7 %
Neutrophils Absolute: 2.5 10*3/uL (ref 1.4–7.0)
Neutrophils: 54 %
Platelets: 199 10*3/uL (ref 150–450)
RBC: 5.52 x10E6/uL (ref 4.14–5.80)
RDW: 12.6 % (ref 11.6–15.4)
WBC: 4.6 10*3/uL (ref 3.4–10.8)

## 2021-06-16 LAB — COMPREHENSIVE METABOLIC PANEL
ALT: 26 IU/L (ref 0–44)
AST: 21 IU/L (ref 0–40)
Albumin/Globulin Ratio: 2.4 — ABNORMAL HIGH (ref 1.2–2.2)
Albumin: 5.1 g/dL — ABNORMAL HIGH (ref 4.0–5.0)
Alkaline Phosphatase: 78 IU/L (ref 44–121)
BUN/Creatinine Ratio: 15 (ref 9–20)
BUN: 15 mg/dL (ref 6–24)
Bilirubin Total: 0.5 mg/dL (ref 0.0–1.2)
CO2: 23 mmol/L (ref 20–29)
Calcium: 9.6 mg/dL (ref 8.7–10.2)
Chloride: 102 mmol/L (ref 96–106)
Creatinine, Ser: 1.02 mg/dL (ref 0.76–1.27)
Globulin, Total: 2.1 g/dL (ref 1.5–4.5)
Glucose: 94 mg/dL (ref 70–99)
Potassium: 4.1 mmol/L (ref 3.5–5.2)
Sodium: 140 mmol/L (ref 134–144)
Total Protein: 7.2 g/dL (ref 6.0–8.5)
eGFR: 92 mL/min/{1.73_m2} (ref >59)

## 2021-06-16 LAB — CK: Total CK: 67 U/L (ref 49–439)

## 2021-06-16 LAB — THYROID PANEL WITH TSH
Free Thyroxine Index: 1.8 (ref 1.2–4.9)
T3 Uptake Ratio: 28 % (ref 24–39)
T4, Total: 6.5 ug/dL (ref 4.5–12.0)
TSH: 2.07 u[IU]/mL (ref 0.450–4.500)

## 2021-06-16 LAB — ACETYLCHOLINE RECEPTOR, BINDING: AChR Binding Ab, Serum: <0.03 nmol/L (ref 0.00–0.24)

## 2021-06-16 LAB — SEDIMENTATION RATE: Sed Rate: 3 mm/hr (ref 0–15)

## 2021-06-16 LAB — MAGNESIUM: Magnesium: 2.1 mg/dL (ref 1.6–2.3)

## 2021-06-16 LAB — C-REACTIVE PROTEIN: CRP: <1 mg/L (ref 0–10)

## 2021-06-21 ENCOUNTER — Encounter: Payer: Self-pay | Admitting: Gastroenterology

## 2021-07-04 ENCOUNTER — Other Ambulatory Visit (HOSPITAL_COMMUNITY): Payer: Self-pay

## 2021-07-27 ENCOUNTER — Ambulatory Visit (AMBULATORY_SURGERY_CENTER): Payer: 59 | Admitting: *Deleted

## 2021-07-27 ENCOUNTER — Other Ambulatory Visit (HOSPITAL_COMMUNITY): Payer: Self-pay

## 2021-07-27 ENCOUNTER — Other Ambulatory Visit: Payer: Self-pay

## 2021-07-27 VITALS — Ht 69.0 in | Wt 181.0 lb

## 2021-07-27 DIAGNOSIS — Z1211 Encounter for screening for malignant neoplasm of colon: Secondary | ICD-10-CM

## 2021-07-27 MED ORDER — NA SULFATE-K SULFATE-MG SULF 17.5-3.13-1.6 GM/177ML PO SOLN
2.0000 | Freq: Once | ORAL | 0 refills | Status: AC
  Filled 2021-07-27 – 2021-08-05 (×2): qty 354, 1d supply, fill #0

## 2021-07-27 NOTE — Progress Notes (Signed)

## 2021-08-04 ENCOUNTER — Other Ambulatory Visit (HOSPITAL_COMMUNITY): Payer: Self-pay

## 2021-08-05 ENCOUNTER — Encounter: Payer: Self-pay | Admitting: Gastroenterology

## 2021-08-05 ENCOUNTER — Other Ambulatory Visit (HOSPITAL_COMMUNITY): Payer: Self-pay

## 2021-08-10 ENCOUNTER — Encounter: Payer: Self-pay | Admitting: Gastroenterology

## 2021-08-10 ENCOUNTER — Ambulatory Visit (AMBULATORY_SURGERY_CENTER): Payer: No Typology Code available for payment source | Admitting: Gastroenterology

## 2021-08-10 ENCOUNTER — Other Ambulatory Visit: Payer: Self-pay

## 2021-08-10 VITALS — BP 122/86 | HR 57 | Temp 98.6°F | Resp 16 | Ht 69.0 in | Wt 181.0 lb

## 2021-08-10 DIAGNOSIS — Z1211 Encounter for screening for malignant neoplasm of colon: Secondary | ICD-10-CM | POA: Diagnosis present

## 2021-08-10 MED ORDER — SODIUM CHLORIDE 0.9 % IV SOLN: 500.0000 mL | Freq: Once | INTRAVENOUS | Status: DC

## 2021-08-10 NOTE — Progress Notes (Signed)
Pt's states no medical or surgical changes since previsit or office visit. VS assessed by C.W 

## 2021-08-10 NOTE — Progress Notes (Signed)
HPI: ?This is a man at routine risk for CRC ? ? ?ROS: complete GI ROS as described in HPI, all other review negative. ? ?Constitutional:  No unintentional weight loss ? ? ?Past Medical History:  ?Diagnosis Date  ? Allergy   ? Fainting spell   ? Giardia 2009  ? Hypertension   ? ? ?Past Surgical History:  ?Procedure Laterality Date  ? SPERMATOCELECTOMY  2000  ? Dr Isabel Caprice  ? TONSILLECTOMY  1992  ? Done in [place]  ? ? ?Current Outpatient Medications  ?Medication Sig Dispense Refill  ? buPROPion (WELLBUTRIN XL) 150 MG 24 hr tablet Take 1 tablet (150 mg total) by mouth daily. 90 tablet 3  ? Cholecalciferol (VITAMIN D) 1000 UNITS capsule Take 2,000 Units by mouth daily.    ? lisinopril (ZESTRIL) 20 MG tablet Take 1 tablet (20 mg total) by mouth daily. 90 tablet 4  ? clobetasol cream (TEMOVATE) 0.05 % Apply topically to the affected area 2 times daily as needed (Patient not taking: Reported on 07/27/2021) 45 g 6  ? flunisolide (NASAREL) 29 MCG/ACT (0.025%) nasal spray Place 2 sprays into the nose 2 (two) times daily. Dose is for each nostril. (Patient not taking: Reported on 07/27/2021) 25 mL 3  ? fluticasone (FLONASE) 50 MCG/ACT nasal spray 2 sprays  in each nostril    ? loratadine (CLARITIN) 10 MG tablet Take 10 mg by mouth as needed.   (Patient not taking: Reported on 07/27/2021)    ? Multiple Vitamin (MULTIVITAMIN) tablet Take 1 tablet by mouth daily. (Patient not taking: Reported on 08/10/2021)    ? Pramoxine HCl (PROCTOFOAM RE) Place rectally as needed.    ? ?Current Facility-Administered Medications  ?Medication Dose Route Frequency Provider Last Rate Last Admin  ? 0.9 %  sodium chloride infusion  500 mL Intravenous Once Rachael Fee, MD      ? ? ?Allergies as of 08/10/2021  ? (No Known Allergies)  ? ? ?Family History  ?Problem Relation Age of Onset  ? Hypertension Mother   ? Colon polyps Father   ? Hyperlipidemia Father   ? Hypertension Father   ? Gout Father   ? Stroke Maternal Grandmother   ? Hypertension Maternal  Grandmother   ? Cancer Maternal Grandfather   ?     lung  ? Arthritis Paternal Grandmother   ?     rheumatoid  ? Arthritis Paternal Grandfather   ?     rheumatoid  ? Gout Paternal Grandfather   ? Colon cancer Neg Hx   ? Esophageal cancer Neg Hx   ? Stomach cancer Neg Hx   ? Rectal cancer Neg Hx   ? ? ?Social History  ? ?Socioeconomic History  ? Marital status: Single  ?  Spouse name: Not on file  ? Number of children: 0  ? Years of education: Not on file  ? Highest education level: Not on file  ?Occupational History  ? 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  Employer: [employer]  ?Tobacco Use  ? Smoking status: Former  ?  Years: 10.00  ?  Types: Cigarettes  ? Smokeless tobacco: Never  ?Vaping Use  ? Vaping Use: Never used  ?Substance and Sexual Activity  ? Alcohol use: Yes  ?  Alcohol/week: 6.0 standard drinks  ?  Types: 6 Cans of beer per week  ?  Comment: on weekends  ? Drug use: No  ? Sexual activity: Not on file  ?Other Topics Concern  ?  Not on file  ?Social History Narrative  ? Lives  ? Regular Exercise: 6 days weekly  ? Caffeine use:  4 cups coffee daily  ? Works in cath lab.    ? 3 dogs (2 schnauzers and a terrier pound dog)  ? Has a partner who lives in [place]- he works in the Radiographer, therapeutic.   ?   ? ?Social Determinants of Health  ? ?Financial Resource Strain: Not on file  ?Food Insecurity: Not on file  ?Transportation Needs: Not on file  ?Physical Activity: Not on file  ?Stress: Not on file  ?Social Connections: Not on file  ?Intimate Partner Violence: Not on file  ? ? ? ?Physical Exam: ?BP (!) 158/111   Pulse (!) 59   Temp 98.6 ?F (37 ?C) (Skin)   Ht 5\' 9"  (1.753 m)   Wt 181 lb (82.1 kg)   SpO2 100%   BMI 26.73 kg/m?  ?Constitutional: generally well-appearing ?Psychiatric: alert and oriented x3 ?Lungs: CTA bilaterally ?Heart: no MCR ? ?Assessment and plan: ?46 y.o. male with routine risk for CRC ? ?Screening colonoscopy today ? ?Care is appropriate for the ambulatory setting. ? ?49,  MD ?[facility] ?08/10/2021, 8:53 AM ? ? ? ?

## 2021-08-10 NOTE — Op Note (Signed)
[facility] ?Patient Name: William Dorsey ?Procedure Date: 08/10/2021 9:31 AM ?MRN: [id number] ?Endoscopist: Rachael Fee , MD ?Age: 46 ?Referring MD:  ?Date of Birth: 08-09-1975 ?Gender: Male ?Account #: [id number] ?Procedure:                Colonoscopy ?Indications:              Screening for colorectal malignant neoplasm ?Medicines:                Monitored Anesthesia Care ?Procedure:                Pre-Anesthesia Assessment: ?                          - Prior to the procedure, a History and Physical  ?                          was performed, and patient medications and  ?                          allergies were reviewed. The patient's tolerance of  ?                          previous anesthesia was also reviewed. The risks  ?                          and benefits of the procedure and the sedation  ?                          options and risks were discussed with the patient.  ?                          All questions were answered, and informed consent  ?                          was obtained. Prior Anticoagulants: The patient has  ?                          taken no previous anticoagulant or antiplatelet  ?                          agents. ASA Grade Assessment: II - A patient with  ?                          mild systemic disease. After reviewing the risks  ?                          and benefits, the patient was deemed in  ?                          satisfactory condition to undergo the procedure. ?                          After obtaining informed consent, the colonoscope  ?  was passed under direct vision. Throughout the  ?                          procedure, the patient's blood pressure, pulse, and  ?                          oxygen saturations were monitored continuously. The  ?                          Olympus CF-HQ190L (Serial# [id number]) Colonoscope was  ?                          introduced through the anus and advanced to the the  ?                          cecum, identified  by appendiceal orifice and  ?                          ileocecal valve. The colonoscopy was performed  ?                          without difficulty. The patient tolerated the  ?                          procedure well. The quality of the bowel  ?                          preparation was good. The ileocecal valve,  ?                          appendiceal orifice, and rectum were photographed. ?Scope In: 9:36:00 AM ?Scope Out: 9:49:31 AM ?Scope Withdrawal Time: 0 hours 9 minutes 14 seconds  ?Total Procedure Duration: 0 hours 13 minutes 31 seconds  ?Findings:                 The entire examined colon appeared normal on direct  ?                          and retroflexion views. ?Complications:            No immediate complications. Estimated blood loss:  ?                          None. ?Estimated Blood Loss:     Estimated blood loss: none. ?Impression:               - The entire examined colon is normal on direct and  ?                          retroflexion views. ?                          - No polyps or cancers. ?Recommendation:           - Patient has a contact number available for  ?  emergencies. The signs and symptoms of potential  ?                          delayed complications were discussed with the  ?                          patient. Return to normal activities tomorrow.  ?                          Written discharge instructions were provided to the  ?                          patient. ?                          - Resume previous diet. ?                          - Continue present medications. ?                          - Repeat colonoscopy in 10 years for screening. ?Rachael Fee, MD ?08/10/2021 9:51:41 AM ?This report has been signed electronically. ?

## 2021-08-10 NOTE — Patient Instructions (Addendum)
Continue current medications.  ? ?Repeat 10 colonoscopy in years. ? ?YOU HAD AN ENDOSCOPIC PROCEDURE TODAY AT THE [facility]:   Refer to the procedure report that was given to you for any specific questions about what was found during the examination.  If the procedure report does not answer your questions, please call your gastroenterologist to clarify.  If you requested that your care partner not be given the details of your procedure findings, then the procedure report has been included in a sealed envelope for you to review at your convenience later. ? ?YOU SHOULD EXPECT: Some feelings of bloating in the abdomen. Passage of more gas than usual.  Walking can help get rid of the air that was put into your GI tract during the procedure and reduce the bloating. If you had a lower endoscopy (such as a colonoscopy or flexible sigmoidoscopy) you may notice spotting of blood in your stool or on the toilet paper. If you underwent a bowel prep for your procedure, you may not have a normal bowel movement for a few days. ? ?Please Note:  You might notice some irritation and congestion in your nose or some drainage.  This is from the oxygen used during your procedure.  There is no need for concern and it should clear up in a day or so. ? ?SYMPTOMS TO REPORT IMMEDIATELY: ? ?Following lower endoscopy (colonoscopy or flexible sigmoidoscopy): ? Excessive amounts of blood in the stool ? Significant tenderness or worsening of abdominal pains ? Swelling of the abdomen that is new, acute ? Fever of 100?F or higher ? ?For urgent or emergent issues, a gastroenterologist can be reached at any hour by calling [phone]. ?Do not use MyChart messaging for urgent concerns.  ? ? ?DIET:  We do recommend a small meal at first, but then you may proceed to your regular diet.  Drink plenty of fluids but you should avoid alcoholic beverages for 24 hours. ? ?ACTIVITY:  You should plan to take it easy for the rest of today and  you should NOT DRIVE or use heavy machinery until tomorrow (because of the sedation medicines used during the test).   ? ?FOLLOW UP: ?Our staff will call the number listed on your records 48-72 hours following your procedure to check on you and address any questions or concerns that you may have regarding the information given to you following your procedure. If we do not reach you, we will leave a message.  We will attempt to reach you two times.  During this call, we will ask if you have developed any symptoms of COVID 19. If you develop any symptoms (ie: fever, flu-like symptoms, shortness of breath, cough etc.) before then, please call [phone].  If you test positive for Covid 19 in the 2 weeks post procedure, please call and report this information to [place].   ? ?If any biopsies were taken you will be contacted by phone or by letter within the next 1-3 weeks.  Please call us at [phone] if you have not heard about the biopsies in 3 weeks.  ? ? ?SIGNATURES/CONFIDENTIALITY: ?You and/or your care partner have signed paperwork which will be entered into your electronic medical record.  These signatures attest to the fact that that the information above on your After Visit Summary has been reviewed and is understood.  Full responsibility of the confidentiality of this discharge information lies with you and/or your care-partner. ? ?

## 2021-08-10 NOTE — Progress Notes (Signed)
PT taken to PACU. Monitors in place. VSS. Report given to RN. 

## 2021-08-12 ENCOUNTER — Telehealth: Payer: Self-pay | Admitting: *Deleted

## 2021-08-12 ENCOUNTER — Telehealth: Payer: Self-pay

## 2021-08-12 NOTE — Telephone Encounter (Signed)
No answer, left message to call back later today, B.Maria Gallicchio RN. 

## 2021-08-12 NOTE — Telephone Encounter (Signed)
Second attempt, left VM.  

## 2021-09-12 ENCOUNTER — Other Ambulatory Visit (HOSPITAL_COMMUNITY): Payer: Self-pay

## 2021-09-13 ENCOUNTER — Other Ambulatory Visit (HOSPITAL_COMMUNITY): Payer: Self-pay

## 2021-09-14 ENCOUNTER — Other Ambulatory Visit (HOSPITAL_COMMUNITY): Payer: Self-pay

## 2021-11-15 ENCOUNTER — Other Ambulatory Visit (HOSPITAL_COMMUNITY): Payer: Self-pay

## 2021-11-16 ENCOUNTER — Other Ambulatory Visit (HOSPITAL_COMMUNITY): Payer: Self-pay

## 2021-11-16 MED ORDER — ALPRAZOLAM 0.25 MG PO TABS
0.2500 mg | ORAL_TABLET | Freq: Every evening | ORAL | 0 refills | Status: DC | PRN
  Filled 2021-11-16 – 2021-12-23 (×2): qty 30, 30d supply, fill #0

## 2021-11-24 ENCOUNTER — Other Ambulatory Visit (HOSPITAL_COMMUNITY): Payer: Self-pay

## 2021-12-20 ENCOUNTER — Ambulatory Visit (INDEPENDENT_AMBULATORY_CARE_PROVIDER_SITE_OTHER): Payer: No Typology Code available for payment source | Admitting: Neurology

## 2021-12-20 ENCOUNTER — Encounter: Payer: Self-pay | Admitting: Neurology

## 2021-12-20 ENCOUNTER — Other Ambulatory Visit (HOSPITAL_COMMUNITY): Payer: Self-pay

## 2021-12-20 VITALS — BP 152/93 | HR 56 | Ht 69.0 in | Wt 193.0 lb

## 2021-12-20 DIAGNOSIS — R200 Anesthesia of skin: Secondary | ICD-10-CM | POA: Diagnosis not present

## 2021-12-20 DIAGNOSIS — F32A Depression, unspecified: Secondary | ICD-10-CM

## 2021-12-20 DIAGNOSIS — R42 Dizziness and giddiness: Secondary | ICD-10-CM | POA: Diagnosis not present

## 2021-12-20 DIAGNOSIS — F419 Anxiety disorder, unspecified: Secondary | ICD-10-CM

## 2021-12-20 DIAGNOSIS — R5383 Other fatigue: Secondary | ICD-10-CM

## 2021-12-20 MED ORDER — BUSPIRONE HCL 15 MG PO TABS
15.0000 mg | ORAL_TABLET | Freq: Two times a day (BID) | ORAL | 3 refills | Status: DC
  Filled 2021-12-20: qty 180, 90d supply, fill #0
  Filled 2022-03-18: qty 180, 90d supply, fill #1
  Filled 2022-06-16: qty 180, 90d supply, fill #2
  Filled 2022-10-11: qty 180, 90d supply, fill #3

## 2021-12-20 MED ORDER — BUPROPION HCL ER (XL) 150 MG PO TB24
150.0000 mg | ORAL_TABLET | Freq: Every day | ORAL | 3 refills | Status: DC
  Filled 2021-12-20: qty 90, 90d supply, fill #0
  Filled 2022-03-18: qty 90, 90d supply, fill #1
  Filled 2022-06-16: qty 90, 90d supply, fill #2
  Filled 2022-10-11: qty 90, 90d supply, fill #3

## 2021-12-20 NOTE — Progress Notes (Signed)
[facility]  PATIENT: William Dorsey DOB: [birth year]  REFERRING DOCTOR OR PCP: Dr. Nicholos Johns. SOURCE: Patient, notes from primary care, imaging and lab reports, CT scan personally reviewed.  _________________________________   HISTORICAL  CHIEF COMPLAINT:  Chief Complaint  Patient presents with   Follow-up    Rm 1, alone. Here for 6 month f/u. Fatigue has improved. Has had increase in dizziness. R Dorsey numbness is more constant and more on top of Dorsey. Gait still off. Intermittent weakness in his UE.     HISTORY OF PRESENT ILLNESS:  William Dorsey is a 46 y.o. man with headaches, fatigue and left-sided numbness.  Update 12/10/2021 He feels his fatigue is doing better.   He sleeps well most nights.   He is on Wellbutrin and feels better with it.  He does still feel worse if he overexerts himself like athletic participation.    He is still haing some dizziness and doing worse with it.  When stressed or anxious or after overexerting himself, the next day he feels vertigo - spinning and balace off.   If he sits a long time and then stands, he feels his legs take longer to get going.  He has right Dorsey numbness, mostly the dorsum - sometimes thing to Dorsey and other days just the Dorsey.   As day goes on, he often notes less numbness.      He feels less brain fog compared to last visit.   He works as a Engineer, civil (consulting) in [employer].     He now is not having headaches.  He had a lot October 2021- early 2022.    He has some left lower leg numbness and left Dorsey numbness but this is rare.   This is better  He had a run of AFib January 2022 and had extensive labwork.      He sleeps well at night 8-9 hours on weekdays and up to 12 hours on weekends.  He does not snore.    Although he is tired, he is not sleepy and does not doze off.     He takes lisinopril (since age 6), MVI, CoQ10.   He only rarely takes alprazolam, hydroxyzine or methocarbamol --- none in last  couple month  History of symptoms:   Symptoms started  after his second Covid-19 vacccination 06/20/2019.   After the vaccination he had a severe frontal headache and flu-like symptoms and dizziness.  He improved but still has issues.    He has felt very fatigued, both physically and cognitively over the summer but had no specific neurologic symptom.   Exercise became more difficult.  At the end of October 2021, he began to experience left sided numbness, leg > arm.  Balance was ok but he felt dizzy and lightheadedness.  Symptoms fluctuated.  Numbness resolved over the next week.   He continues to have a tight sensation in the left arm and leg like after a long workout.   He feels the left leg is heavier.   He is still able to jog 4 miles.without any problems and feels better afterwards.    He has had ED since mid October after the episode.   Bladder function is fine.   Vision is fine.     Her mother was diagnosed with MS in 43 at age 75 but was symptomatic a few years before that.  She was in a wheelchair by the mid 1990's  He works as a Engineer, civil (consulting).   He  tries to eat well and exercises regularly.   He takes cod liver oil, seaweed, algae, fish oil.     Vascular risks:  HTN.   No tobacco or DM.   No migraine.  Eats well, exercises some  Imaging  MRI of the brain 05/11/2020 showed a few punctate T2/FLAIR hyperintense foci in the subcortical and deep white matter of the cerebral hemispheres.  This is a nonspecific finding and could be incidental or due to very minimal chronic microvascular ischemic change or the sequela of migraine headache.      There were no acute findings.  There is a normal enhancement pattern.  MRI of the cervical spine 05/11/2020 showed a normal spinal cord and mild degenerative changes that did not lead to spinal stenosis or nerve root compression.  REVIEW OF SYSTEMS: Constitutional: No fevers, chills, sweats, or change in appetite.  He has fatigue Eyes: No visual changes, double  vision, eye pain Ear, nose and throat: No hearing loss, ear pain, nasal congestion, sore throat Cardiovascular: No chest pain, palpitations Respiratory:  No shortness of breath at rest or with exertion.   No wheezes GastrointestinaI: No nausea, vomiting, diarrhea, abdominal pain, fecal incontinence Genitourinary:  No dysuria, urinary retention or frequency.  No nocturia. Musculoskeletal:  No neck pain, back pain Integumentary: No rash, pruritus, skin lesions Neurological: as above Psychiatric: No depression at this time.  Some anxiety Endocrine: No palpitations, diaphoresis, change in appetite, change in weigh or increased thirst Hematologic/Lymphatic:  No anemia, purpura, petechiae. Allergic/Immunologic: No itchy/runny eyes, nasal congestion, recent allergic reactions, rashes  ALLERGIES: No Known Allergies  HOME MEDICATIONS:  Current Outpatient Medications:    ALPRAZolam (XANAX) 0.25 MG tablet, Take 1 tablet (0.25 mg total) by mouth at bedtime as needed., Disp: 30 tablet, Rfl: 0   busPIRone (BUSPAR) 15 MG tablet, Take 1 tablet (15 mg total) by mouth 2 (two) times daily., Disp: 180 tablet, Rfl: 3   Cholecalciferol (VITAMIN D) 1000 UNITS capsule, Take 2,000 Units by mouth daily., Disp: , Rfl:    clobetasol cream (TEMOVATE) 0.05 %, Apply topically to the affected area 2 times daily as needed, Disp: 45 g, Rfl: 6   flunisolide (NASAREL) 29 MCG/ACT (0.025%) nasal spray, Place 2 sprays into the nose 2 (two) times daily. Dose is for each nostril., Disp: 25 mL, Rfl: 3   fluticasone (FLONASE) 50 MCG/ACT nasal spray, 2 sprays  in each nostril, Disp: , Rfl:    lisinopril (ZESTRIL) 20 MG tablet, Take 1 tablet (20 mg total) by mouth daily., Disp: 90 tablet, Rfl: 4   loratadine (CLARITIN) 10 MG tablet, Take 10 mg by mouth as needed., Disp: , Rfl:    Multiple Vitamin (MULTIVITAMIN) tablet, Take 1 tablet by mouth daily., Disp: , Rfl:    Pramoxine HCl (PROCTOFOAM RE), Place rectally as needed., Disp: ,  Rfl:    buPROPion (WELLBUTRIN XL) 150 MG 24 hr tablet, Take 1 tablet (150 mg total) by mouth daily., Disp: 90 tablet, Rfl: 3  PAST MEDICAL HISTORY: Past Medical History:  Diagnosis Date   Allergy    Fainting spell    Giardia 2009   Hypertension     PAST SURGICAL HISTORY: Past Surgical History:  Procedure Laterality Date   SPERMATOCELECTOMY  2000   Dr Isabel Caprice   TONSILLECTOMY  1992   Done in [place]    FAMILY HISTORY: Family History  Problem Relation Age of Onset   Hypertension Mother    Colon polyps Father    Hyperlipidemia Father  Hypertension Father    Gout Father    Stroke Maternal Grandmother    Hypertension Maternal Grandmother    Cancer Maternal Grandfather        lung   Arthritis Paternal Grandmother        rheumatoid   Arthritis Paternal Grandfather        rheumatoid   Gout Paternal Grandfather    Colon cancer Neg Hx    Esophageal cancer Neg Hx    Stomach cancer Neg Hx    Rectal cancer Neg Hx     SOCIAL HISTORY:  Social History   Socioeconomic History   Marital status: Single    Spouse name: Not on file   Number of children: 0   Years of education: Not on file   Highest education level: Not on file  Occupational History   Occupation: Engineer, civil (consulting) in Engineer, 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     3 dogs (2 schnauzers and a terrier pound dog)   Has a partner who lives in [place]- he works in the Radiographer, therapeutic.       Social Determinants of  Health   Financial Resource Strain: Not on file  Food Insecurity: Not on file  Transportation Needs: Not on file  Physical Activity: Not on file  Stress: Not on file  Social Connections: Not on file  Intimate Partner Violence: Not on file     PHYSICAL EXAM  Vitals:   12/20/21 0825  BP: (!) 152/93  Pulse: (!) 56  Weight: 193 lb (87.5 kg)  Height: 5\' 9"  (1.753 m)    Body mass index is 28.5 kg/m.   General: The patient is well-developed and well-nourished and in no acute distress  HEENT:  Head is [place]/AT.  Sclera are anicteric.    Neck/back/musculoskeletal:  The neck is nontender.  No FMS tender point tenderness  Skin: Extremities are without rash or  edema.    Neurologic Exam  Mental status: The patient is alert and oriented x 3 at the time of the examination. The patient has apparent normal recent and remote memory, with an apparently normal attention span and concentration ability.   Speech is normal.  Cranial nerves: Extraocular movements are full. Normal facial strength.   No ptosis.   No obvious hearing deficits are noted.  Motor:  Muscle bulk is normal.   Tone is normal. Strength is  5 / 5 in all 4 extremities.   Sensory: Sensory testing is intact to pinprick, soft touch and vibration sensation in all 4 extremities except  Coordination: Cerebellar testing reveals good finger-nose-finger and heel-to-shin bilaterally.  Gait and station: Station is normal.   Gait is normal. Tandem gait is normal. Romberg is negative.   Reflexes: Deep tendon reflexes are symmetric and normal bilaterally.  ASSESSMENT AND PLAN  Vertigo - Plan: MR BRAIN/IAC W WO CONTRAST  Numbness - Plan: MR BRAIN/IAC W WO CONTRAST  Other fatigue  Anxiety  Depression, unspecified depression type   Fatigue has done better since earlier this year and starting Wellbutrin.  He needs to stay active and exercise as tolerated. \Due to the vertigo and numbness, we will check an MRI of  the brain.   Add buspirone for anxiety and continue Wellbutrin 4.   Rtc 6 months     William Aguilera A. Epimenio Foot, MD, [facility] 12/20/2021, 10:50 AM Certified in Neurology, Clinical Neurophysiology, Sleep Medicine and Neuroimaging  [facility] [address] [place], [place] [address] [phone]

## 2021-12-23 ENCOUNTER — Other Ambulatory Visit (HOSPITAL_COMMUNITY): Payer: Self-pay

## 2021-12-23 MED ORDER — LISINOPRIL 20 MG PO TABS
20.0000 mg | ORAL_TABLET | Freq: Every day | ORAL | 4 refills | Status: DC
  Filled 2021-12-23: qty 90, 90d supply, fill #0
  Filled 2022-03-18: qty 90, 90d supply, fill #1
  Filled 2022-06-16: qty 90, 90d supply, fill #2
  Filled 2022-10-11: qty 90, 90d supply, fill #3

## 2021-12-26 ENCOUNTER — Telehealth: Payer: Self-pay | Admitting: Neurology

## 2021-12-26 NOTE — Telephone Encounter (Signed)
60 mins MRI brain/IAC w/wo contrast Dr. Wylie Hail Berkley Harvey: [id number] exp. 12/22/21-01/21/22 scheduled at [facility] 12/27/21 at 4pm

## 2021-12-27 ENCOUNTER — Ambulatory Visit: Payer: No Typology Code available for payment source

## 2021-12-27 DIAGNOSIS — R200 Anesthesia of skin: Secondary | ICD-10-CM | POA: Diagnosis not present

## 2021-12-27 DIAGNOSIS — R42 Dizziness and giddiness: Secondary | ICD-10-CM | POA: Diagnosis not present

## 2021-12-27 MED ORDER — GADOBENATE DIMEGLUMINE 529 MG/ML IV SOLN
18.0000 mL | Freq: Once | INTRAVENOUS | Status: AC | PRN
  Administered 2021-12-27: 18 mL via INTRAVENOUS

## 2022-02-01 ENCOUNTER — Other Ambulatory Visit (HOSPITAL_COMMUNITY): Payer: Self-pay

## 2022-03-20 ENCOUNTER — Other Ambulatory Visit (HOSPITAL_COMMUNITY): Payer: Self-pay

## 2022-03-22 ENCOUNTER — Other Ambulatory Visit (HOSPITAL_COMMUNITY): Payer: Self-pay

## 2022-03-22 ENCOUNTER — Encounter (HOSPITAL_COMMUNITY): Payer: Self-pay | Admitting: Pharmacist

## 2022-06-16 ENCOUNTER — Other Ambulatory Visit: Payer: Self-pay

## 2022-06-16 ENCOUNTER — Other Ambulatory Visit (HOSPITAL_COMMUNITY): Payer: Self-pay

## 2022-06-16 MED ORDER — ALPRAZOLAM 0.25 MG PO TABS
0.2500 mg | ORAL_TABLET | Freq: Every evening | ORAL | 0 refills | Status: DC | PRN
  Filled 2022-06-16 – 2022-08-15 (×2): qty 30, 30d supply, fill #0

## 2022-06-28 ENCOUNTER — Other Ambulatory Visit (HOSPITAL_COMMUNITY): Payer: Self-pay

## 2022-08-15 ENCOUNTER — Other Ambulatory Visit (HOSPITAL_COMMUNITY): Payer: Self-pay

## 2022-11-23 DIAGNOSIS — Z125 Encounter for screening for malignant neoplasm of prostate: Secondary | ICD-10-CM | POA: Diagnosis not present

## 2022-11-23 DIAGNOSIS — E782 Mixed hyperlipidemia: Secondary | ICD-10-CM | POA: Diagnosis not present

## 2022-11-23 DIAGNOSIS — Z0000 Encounter for general adult medical examination without abnormal findings: Secondary | ICD-10-CM | POA: Diagnosis not present

## 2022-11-27 ENCOUNTER — Encounter: Payer: Self-pay | Admitting: Neurology

## 2022-11-27 ENCOUNTER — Other Ambulatory Visit (HOSPITAL_COMMUNITY): Payer: Self-pay

## 2022-11-27 ENCOUNTER — Ambulatory Visit: Payer: 59 | Admitting: Neurology

## 2022-11-27 VITALS — BP 148/93 | HR 60 | Ht 69.0 in | Wt 192.4 lb

## 2022-11-27 DIAGNOSIS — E782 Mixed hyperlipidemia: Secondary | ICD-10-CM | POA: Diagnosis not present

## 2022-11-27 DIAGNOSIS — M62838 Other muscle spasm: Secondary | ICD-10-CM

## 2022-11-27 DIAGNOSIS — I10 Essential (primary) hypertension: Secondary | ICD-10-CM | POA: Diagnosis not present

## 2022-11-27 DIAGNOSIS — R5383 Other fatigue: Secondary | ICD-10-CM | POA: Diagnosis not present

## 2022-11-27 DIAGNOSIS — R200 Anesthesia of skin: Secondary | ICD-10-CM

## 2022-11-27 DIAGNOSIS — Z0000 Encounter for general adult medical examination without abnormal findings: Secondary | ICD-10-CM | POA: Diagnosis not present

## 2022-11-27 DIAGNOSIS — Z125 Encounter for screening for malignant neoplasm of prostate: Secondary | ICD-10-CM | POA: Diagnosis not present

## 2022-11-27 MED ORDER — BUPROPION HCL ER (XL) 150 MG PO TB24
150.0000 mg | ORAL_TABLET | Freq: Every day | ORAL | 3 refills | Status: DC
  Filled 2022-11-27 – 2023-01-06 (×2): qty 90, 90d supply, fill #0

## 2022-11-27 MED ORDER — BUSPIRONE HCL 15 MG PO TABS
15.0000 mg | ORAL_TABLET | Freq: Two times a day (BID) | ORAL | 3 refills | Status: DC
  Filled 2022-11-27 – 2023-01-06 (×2): qty 180, 90d supply, fill #0

## 2022-11-27 NOTE — Progress Notes (Signed)
[facility]  PATIENT: William Dorsey DOB: Oct 06, 1975  REFERRING DOCTOR OR PCP: Dr. Nicholos Johns. SOURCE: Patient, notes from primary care, imaging and lab reports, CT scan personally reviewed.  _________________________________   HISTORICAL  CHIEF COMPLAINT:  Chief Complaint  Patient presents with   Room 10    Pt is here Alone. Pt states that his Fatigue has gotten better. Pt state that his is having some numbness and heaviness in his hand and toes on his left side. Pt states that he isn't having any burning or tingling sensations. Pt states that he is having some muscle spasms in both of his legs that has started recently that has been consistent.      HISTORY OF PRESENT ILLNESS:  William Dorsey is a 47 y.o. man with headaches, fatigue and left-sided numbness.  Update 11/27/2022 Since last visit, he has had an MRI. The MRI of the brain 12/27/2021 showed Scattered punctate T2/FLAIR hyperintense foci in the hemispheres.  This is a nonspecific finding and is most consistent with chronic microvascular ischemic change or the sequela of migraine headaches.  None of these appear to be acute.  They do not enhance.  Compared to the MRI from 05/11/2020, there were no new lesions.    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    Fatigue is much better and he started exercising well.   His left arm feels heavy but not different enough to limit activities.      He occasionally has twitches in his left calf.   He notes some numbness in the 3rd, 4th and 5th finger on his left.    He notes strain in his shoulder when he rotates it (to look at watch).   Also has some left foot numbness.    He occasionally has neck pain or stiffness.   No bladder changes.    Vertigo has mostly resolved  He had a run of AFib January 2022 and had extensive labwork.      He sleeps well at night 8-9 hours on weekdays  and up to 12 hours on weekends.  He does not snore.     He takes lisinopril (since age 6), MVI, CoQ10.   He only rarely takes alprazolam, hydroxyzine or methocarbamol --- none in last couple month  History of symptoms:   Symptoms started  after his second Covid-19 vacccination 06/20/2019.   After the vaccination he had a severe frontal headache and flu-like symptoms and dizziness.  He improved but still has issues.    He has felt very fatigued, both physically and cognitively over the summer but had no specific neurologic symptom.   Exercise became more difficult.  At the end of October 2021, he began to experience left sided numbness, leg > arm.  Balance was ok but he felt dizzy and lightheadedness.  Symptoms fluctuated.  Numbness resolved over the next week.   He continues to have a tight sensation in the left arm and leg like after a long workout.   He feels the left leg is heavier.   He is still able to jog 4 miles.without any problems and feels better afterwards.    He has had ED since mid October after the episode.   Bladder function is fine.   Vision is fine.     Her mother was diagnosed with MS in 33 at age 69 but was symptomatic a few years before that.  She  was in a wheelchair by the mid 1990's  He works as a Engineer, civil (consulting).   He tries to eat well and exercises regularly.   He takes cod liver oil, seaweed, algae, fish oil.     Vascular risks:  HTN.   No tobacco or DM.   No migraine.  Eats well, exercises some  Imaging MRI of the brain 12/27/2021 showed Scattered punctate T2/FLAIR hyperintense foci in the hemispheres.  This is a nonspecific finding and is most consistent with chronic microvascular ischemic change or the sequela of migraine headaches.  None of these appear to be acute.  They do not enhance.  Compared to the MRI from 05/11/2020, there were no new lesions.    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     MRI of the brain 05/11/2020 showed a few punctate T2/FLAIR hyperintense foci in the subcortical and deep white matter of the cerebral hemispheres.  This is a nonspecific finding and could be incidental or due to very minimal chronic microvascular ischemic change or the sequela of migraine headache.      There were no acute findings.  There is a normal enhancement pattern.  MRI of the cervical spine 05/11/2020 showed a normal spinal cord and mild degenerative changes that did not lead to spinal stenosis or nerve root compression.  REVIEW OF SYSTEMS: Constitutional: No fevers, chills, sweats, or change in appetite.  He has fatigue Eyes: No visual changes, double vision, eye pain Ear, nose and throat: No hearing loss, ear pain, nasal congestion, sore throat Cardiovascular: No chest pain, palpitations Respiratory:  No shortness of breath at rest or with exertion.   No wheezes GastrointestinaI: No nausea, vomiting, diarrhea, abdominal pain, fecal incontinence Genitourinary:  No dysuria, urinary retention or frequency.  No nocturia. Musculoskeletal:  No neck pain, back pain Integumentary: No rash, pruritus, skin lesions Neurological: as above Psychiatric: No depression at this time.  Some anxiety Endocrine: No palpitations, diaphoresis, change in appetite, change in weigh or increased thirst Hematologic/Lymphatic:  No anemia, purpura, petechiae. Allergic/Immunologic: No itchy/runny eyes, nasal congestion, recent allergic reactions, rashes  ALLERGIES: No Known Allergies  HOME MEDICATIONS:  Current Outpatient Medications:    ALPRAZolam (XANAX) 0.25 MG tablet, Take 1 tablet (0.25 mg total) by mouth at bedtime as needed., Disp: 30 tablet, Rfl: 0   buPROPion (WELLBUTRIN XL) 150 MG 24 hr tablet, Take 1 tablet (150 mg total) by mouth daily., Disp: 90 tablet, Rfl: 3   busPIRone (BUSPAR) 15 MG tablet, Take 1 tablet (15 mg total) by mouth 2 (two) times daily., Disp: 180 tablet, Rfl: 3    Cholecalciferol (VITAMIN D) 1000 UNITS capsule, Take 2,000 Units by mouth daily., Disp: , Rfl:    clobetasol cream (TEMOVATE) 0.05 %, Apply topically to the affected area 2 times daily as needed, Disp: 45 g, Rfl: 6   flunisolide (NASAREL) 29 MCG/ACT (0.025%) nasal spray, Place 2 sprays into the nose 2 (two) times daily. Dose is for each nostril., Disp: 25 mL, Rfl: 3   fluticasone (FLONASE) 50 MCG/ACT nasal spray, 2 sprays  in each nostril, Disp: , Rfl:    lisinopril (ZESTRIL) 20 MG tablet, Take 1 tablet (20 mg total) by mouth daily., Disp: 90 tablet, Rfl: 4   loratadine (CLARITIN) 10 MG tablet, Take 10 mg by mouth as needed., Disp: , Rfl:    Multiple Vitamin (MULTIVITAMIN) tablet, Take 1 tablet by mouth daily., Disp: , Rfl:  Pramoxine HCl (PROCTOFOAM RE), Place rectally as needed., Disp: , Rfl:   PAST MEDICAL HISTORY: Past Medical History:  Diagnosis Date   Allergy    Fainting spell    Giardia 2009   Hypertension     PAST SURGICAL HISTORY: Past Surgical History:  Procedure Laterality Date   SPERMATOCELECTOMY  2000   Dr Isabel Caprice   TONSILLECTOMY  1992   Done in [place]    FAMILY HISTORY: Family History  Problem Relation Age of Onset   Hypertension Mother    Colon polyps Father    Hyperlipidemia Father    Hypertension Father    Gout Father    Stroke Maternal Grandmother    Hypertension Maternal Grandmother    Cancer Maternal Grandfather        lung   Arthritis Paternal Grandmother        rheumatoid   Arthritis Paternal Grandfather        rheumatoid   Gout Paternal Grandfather    Colon cancer Neg Hx    Esophageal cancer Neg Hx    Stomach cancer Neg Hx    Rectal cancer Neg Hx     SOCIAL HISTORY:  Social History   Socioeconomic History   Marital status: Single    Spouse name: Not on file   Number of children: 0   Years of education: Not on file   Highest education level: Not on file  Occupational History   Occupation: Engineer, civil (consulting) in Engineer, 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     3 dogs (2 schnauzers and a terrier pound dog)   Has a partner who lives in [place]- he works in the Radiographer, therapeutic.       Social Determinants of Health   Financial Resource Strain: Not on file  Food Insecurity: Not on file  Transportation Needs: Not on file  Physical Activity: Not on file  Stress: Not on file  Social Connections: Not on file  Intimate Partner Violence: Not on file     PHYSICAL EXAM  Vitals:   11/27/22 0828  BP: (!) 148/93  Pulse: 60  Weight: 192 lb 6.4 oz (87.3 kg)  Height: 5\' 9"  (1.753 m)    Body mass index is 28.41 kg/m.   General: The patient is well-developed and well-nourished and in no acute distress  HEENT:  Head is [place]/AT.  Sclera are anicteric.    Neck/back/musculoskeletal: Neck has good range of motion.  Skin: Extremities are without rash or  edema.    Neurologic Exam  Mental status: The patient is alert and oriented x 3 at the time of the examination. The patient has apparent normal recent and remote memory, with an apparently normal attention span and concentration ability.   Speech is normal.  Cranial nerves: Extraocular movements are full. Normal facial strength.  No ptosis.   No obvious hearing deficits are noted.  Motor:  Muscle bulk is normal.   Tone is normal. Strength is  5 / 5 in all 4 extremities.   Sensory: He has Tinel signs at both elbows.  Sensory testing is intact to touch sensation in all 4 extremities    Coordination: Cerebellar testing reveals good finger-nose-finger and heel-to-shin bilaterally.  Gait and station: Station is normal.   The gait and tandem gait are normal.  Romberg is negative.   Reflexes: Deep tendon reflexes are symmetric and normal bilaterally.          ASSESSMENT AND PLAN  Other fatigue  Numbness  Muscle spasm   Fatigue has done well.   \Stay active and exercise   Continue for anxiety and continue Wellbutrin 4.   Rtc 12 months     Leeasia Secrist A. Epimenio Foot, MD, [facility] 11/27/2022, 8:34 AM Certified in Neurology, Clinical Neurophysiology, Sleep Medicine and Neuroimaging  [facility] [address] [place], [place] [address] [phone]

## 2022-12-21 ENCOUNTER — Ambulatory Visit: Payer: No Typology Code available for payment source | Admitting: Neurology

## 2023-01-04 ENCOUNTER — Other Ambulatory Visit (HOSPITAL_COMMUNITY): Payer: Self-pay

## 2023-01-04 MED ORDER — LISINOPRIL 20 MG PO TABS
20.0000 mg | ORAL_TABLET | Freq: Every day | ORAL | 4 refills | Status: DC
  Filled 2023-01-04: qty 90, 90d supply, fill #0
  Filled 2023-03-31: qty 90, 90d supply, fill #1
  Filled 2023-07-04: qty 90, 90d supply, fill #2
  Filled 2023-09-28 – 2023-10-02 (×2): qty 90, 90d supply, fill #3
  Filled 2024-01-01: qty 90, 90d supply, fill #4

## 2023-01-05 ENCOUNTER — Other Ambulatory Visit (HOSPITAL_COMMUNITY): Payer: Self-pay

## 2023-01-05 MED ORDER — ALPRAZOLAM 0.25 MG PO TABS
0.2500 mg | ORAL_TABLET | Freq: Every evening | ORAL | 0 refills | Status: DC | PRN
  Filled 2023-01-05: qty 30, 30d supply, fill #0

## 2023-01-06 ENCOUNTER — Other Ambulatory Visit (HOSPITAL_COMMUNITY): Payer: Self-pay

## 2023-07-04 ENCOUNTER — Other Ambulatory Visit (HOSPITAL_COMMUNITY): Payer: Self-pay

## 2023-07-05 ENCOUNTER — Other Ambulatory Visit: Payer: Self-pay

## 2023-07-05 ENCOUNTER — Other Ambulatory Visit (HOSPITAL_COMMUNITY): Payer: Self-pay

## 2023-07-05 MED ORDER — ALPRAZOLAM 0.25 MG PO TABS
ORAL_TABLET | Freq: Every day | ORAL | 0 refills | Status: DC
  Filled 2023-07-05: qty 30, 30d supply, fill #0

## 2023-09-28 ENCOUNTER — Other Ambulatory Visit (HOSPITAL_COMMUNITY): Payer: Self-pay

## 2023-10-02 ENCOUNTER — Other Ambulatory Visit (HOSPITAL_COMMUNITY): Payer: Self-pay

## 2023-10-02 MED ORDER — ALPRAZOLAM 0.25 MG PO TABS
0.2500 mg | ORAL_TABLET | Freq: Every evening | ORAL | 0 refills | Status: DC | PRN
  Filled 2023-10-02: qty 30, 30d supply, fill #0

## 2023-12-03 ENCOUNTER — Encounter: Payer: Self-pay | Admitting: Neurology

## 2023-12-03 ENCOUNTER — Ambulatory Visit: Payer: 59 | Admitting: Neurology

## 2023-12-13 DIAGNOSIS — E782 Mixed hyperlipidemia: Secondary | ICD-10-CM | POA: Diagnosis not present

## 2023-12-13 DIAGNOSIS — Z125 Encounter for screening for malignant neoplasm of prostate: Secondary | ICD-10-CM | POA: Diagnosis not present

## 2023-12-13 DIAGNOSIS — F419 Anxiety disorder, unspecified: Secondary | ICD-10-CM | POA: Diagnosis not present

## 2023-12-13 DIAGNOSIS — Z0000 Encounter for general adult medical examination without abnormal findings: Secondary | ICD-10-CM | POA: Diagnosis not present

## 2023-12-13 DIAGNOSIS — I10 Essential (primary) hypertension: Secondary | ICD-10-CM | POA: Diagnosis not present

## 2023-12-17 DIAGNOSIS — I10 Essential (primary) hypertension: Secondary | ICD-10-CM | POA: Diagnosis not present

## 2023-12-17 DIAGNOSIS — Z0000 Encounter for general adult medical examination without abnormal findings: Secondary | ICD-10-CM | POA: Diagnosis not present

## 2023-12-17 DIAGNOSIS — E782 Mixed hyperlipidemia: Secondary | ICD-10-CM | POA: Diagnosis not present

## 2023-12-17 DIAGNOSIS — F419 Anxiety disorder, unspecified: Secondary | ICD-10-CM | POA: Diagnosis not present

## 2024-01-10 DIAGNOSIS — E559 Vitamin D deficiency, unspecified: Secondary | ICD-10-CM | POA: Diagnosis not present

## 2024-01-10 DIAGNOSIS — R7309 Other abnormal glucose: Secondary | ICD-10-CM | POA: Diagnosis not present

## 2024-03-04 ENCOUNTER — Encounter: Payer: Self-pay | Admitting: Neurology

## 2024-03-29 ENCOUNTER — Other Ambulatory Visit (HOSPITAL_COMMUNITY): Payer: Self-pay

## 2024-03-30 ENCOUNTER — Other Ambulatory Visit (HOSPITAL_COMMUNITY): Payer: Self-pay

## 2024-03-30 MED ORDER — LISINOPRIL 20 MG PO TABS
20.0000 mg | ORAL_TABLET | Freq: Every day | ORAL | 4 refills | Status: AC
  Filled 2024-03-30: qty 90, 90d supply, fill #0
  Filled 2024-06-30: qty 90, 90d supply, fill #1

## 2024-03-31 ENCOUNTER — Other Ambulatory Visit (HOSPITAL_COMMUNITY): Payer: Self-pay

## 2024-03-31 ENCOUNTER — Other Ambulatory Visit: Payer: Self-pay

## 2024-03-31 MED ORDER — ALPRAZOLAM 0.25 MG PO TABS
0.2500 mg | ORAL_TABLET | Freq: Every evening | ORAL | 0 refills | Status: AC | PRN
  Filled 2024-03-31: qty 30, 30d supply, fill #0

## 2024-04-05 ENCOUNTER — Other Ambulatory Visit (HOSPITAL_COMMUNITY): Payer: Self-pay

## 2024-04-29 DIAGNOSIS — H35033 Hypertensive retinopathy, bilateral: Secondary | ICD-10-CM | POA: Diagnosis not present

## 2024-04-29 DIAGNOSIS — H43823 Vitreomacular adhesion, bilateral: Secondary | ICD-10-CM | POA: Diagnosis not present

## 2024-04-29 DIAGNOSIS — H33193 Other retinoschisis and retinal cysts, bilateral: Secondary | ICD-10-CM | POA: Diagnosis not present

## 2024-07-02 ENCOUNTER — Ambulatory Visit: Admitting: Neurology

## 2024-07-02 ENCOUNTER — Encounter: Payer: Self-pay | Admitting: Neurology

## 2024-07-02 VITALS — BP 160/81 | HR 67 | Ht 69.0 in | Wt 203.0 lb

## 2024-07-02 DIAGNOSIS — R253 Fasciculation: Secondary | ICD-10-CM

## 2024-07-02 DIAGNOSIS — I10 Essential (primary) hypertension: Secondary | ICD-10-CM | POA: Diagnosis not present

## 2024-07-02 DIAGNOSIS — R29898 Other symptoms and signs involving the musculoskeletal system: Secondary | ICD-10-CM | POA: Diagnosis not present

## 2024-07-02 DIAGNOSIS — R200 Anesthesia of skin: Secondary | ICD-10-CM

## 2024-07-02 NOTE — Patient Instructions (Addendum)
 Check nerve conduction study of left arm and leg.  Further recommendations pending results. ELECTROMYOGRAM AND NERVE CONDUCTION STUDIES (EMG/NCS) INSTRUCTIONS  How to Prepare The neurologist conducting the EMG will need to know if you have certain medical conditions. Tell the neurologist and other EMG lab personnel if you: Have a pacemaker or any other electrical medical device Take blood-thinning medications Have hemophilia, a blood-clotting disorder that causes prolonged bleeding Bathing Take a shower or bath shortly before your exam in order to remove oils from your skin. Dont apply lotions or creams before the exam.  What to Expect Youll likely be asked to change into a hospital gown for the procedure and lie down on an examination table. The following explanations can help you understand what will happen during the exam.  Electrodes. The neurologist or a technician places surface electrodes at various locations on your skin depending on where youre experiencing symptoms. Or the neurologist may insert needle electrodes at different sites depending on your symptoms.  Sensations. The electrodes will at times transmit a tiny electrical current that you may feel as a twinge or spasm. The needle electrode may cause discomfort or pain that usually ends shortly after the needle is removed. If you are concerned about discomfort or pain, you may want to talk to the neurologist about taking a short break during the exam.  Instructions. During the needle EMG, the neurologist will assess whether there is any spontaneous electrical activity when the muscle is at rest - activity that isnt present in healthy muscle tissue - and the degree of activity when you slightly contract the muscle.  He or she will give you instructions on resting and contracting a muscle at appropriate times. Depending on what muscles and nerves the neurologist is examining, he or she may ask you to change positions during the exam.   After your EMG You may experience some temporary, minor bruising where the needle electrode was inserted into your muscle. This bruising should fade within several days. If it persists, contact your primary care doctor.

## 2024-07-02 NOTE — Progress Notes (Signed)
 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  At this point, I do not suspect a primary neurologic disease.  While brain MRIs showed some white matter changes, they are mild and nonspecific but may be attributed to history of HTN (denies history of migraines/headaches), and stable.  A demyelinating disease is not suspected.  Regardless, demyelinating disease would not explain all of his symptoms.  Headaches and dizziness have overall improved.  The left sided symptoms likely a separate issue.  The left hand numbness may be ulnar neuropathy.  He does exhibit some trace left hip weakness of unknown etiology.  He does have fasciculations and has showed me a video of it involving his right calf.  Given the long time frame without progressive decline, motor neuron disease is not suspected.  For further evaluation, will check NCV-EMG of left arm and leg but will request needle exam involving the right calf as well.  Further recommendations pending results. As long as dizziness is resolved and headaches are infrequent and manageable, no preventative medication is warranted at this time. Follow up with PCP regarding BP Further recommendations pending results.  Follow up after testing.   Total time spent on today's visit was 70 minutes dedicated to this patient today, preparing to see patient, examining the patient, ordering tests and/or medications and counseling the patient, documenting clinical information in the EHR or other health record, independently interpreting results and communicating results to the patient/family, discussing treatment and goals, answering patient's questions and coordinating care.     Subjective:   Discussed the use of AI scribe  software for clinical note transcription with the patient, who gave verbal consent to proceed.  History of Present Illness William Dorsey is a 49 year old male with hypertension who presents for headache.  History supplemented by prior neurologist's and referring provider's note.  MRIs personally reviewed.  He developed profound fatigue and marked exercise intolerance in January 2021 following his second COVID vaccine, accompanied by a severe right frontal stabbing headache lasting approximately 36 hours. Prior to this, he maintained a highly active lifestyle. After the initial episode, he experienced a dramatic decline in activity tolerance, with fatigue so severe that for two years he was only able to work and then sleep, unable to perform activities such as mowing the lawn or gardening due to heat intolerance and exhaustion. Over the past year (2025), his fatigue has improved, allowing resumption of some exercise, primarily weight training every third day, though he remains unable to tolerate cardio activities such as walking three miles without significant next-day fatigue.  In October 2021, after consuming alcohol, he awoke with recurrence of severe right frontal headache, dizziness, and disequilibrium. These symptoms, including difficulty staying in driving lanes and severe intolerance to fluorescent lighting, persisted for approximately 18 months. He described visual disturbances in these environments, with a sensation of everything vibrating and increased dizziness. These symptoms have since improved and are now rare and fleeting.  He experiences intermittent right frontal stabbing headaches, typically upon awakening, lasting the entire day and rated as 6/10 in severity. These occur approximately three times per month and are minimally responsive to ibuprofen and acetaminophen. No associated nausea, photophobia, phonophobia, or visual symptoms.  He developed persistent left-sided sensory and  motor symptoms, including numbness of the  posterior left arm and the last three fingers, with numbness starting at the triceps and radiating distally. He experiences intermittent heaviness in the left leg, particularly when climbing stairs, sometimes resulting in stumbling but without falls. He notes difficulty wearing sandals, requiring him to curl his left toes to keep the sandal on. The sensation of his feet being in vice grips has resolved, but left foot heaviness persists with variable intensity. No significant numbness or pain in the left leg, except for the prior vice grip sensation. He also experiences muscle fasciculations, particularly in the right calf, left triceps, and shoulders, which are more pronounced after activity and at night. He perceives persistent weakness on the left side, with slower muscle growth compared to the right, though with increased effort, muscle size is now more symmetric. No similar symptoms on the right side.  Denies neck or back pain.    He has persistent eye strain at work, with bloodshot eyes within an hour of exposure to fluorescent lighting. 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 He also experienced a period of intolerance to Apple Watch vibration, described as painful sensations, which has since resolved.  He was seen by Dr. Vear at [facility].  He underwent extensive workup.  Labs in December 2021 revealed B12 1037, vit D 40.6, MRI of brain that same month showed minimal nonspecific T2/FLAIR hyperintense foci in the subcortical and deep white matter as well as small DVA in left frontal lobe.  MRI of C-spine showed just mild degenerative changes but overall unremarkable.  He had repeat MRI of brain/IAC with and without contrast in August 2023 which was stable.    Other labs performed in January 2023 include CK 67, TSH 2.070, T4 6.5, negative AChR binding Ab, sed rate 3, negative  CRP, Mg 2.1, and unremarkable CBC and CMP.    He was subsequently diagnosed with chronic fatigue syndrome but no definite diagnosis for his symptoms.    He has no prior history of neurological symptoms before the onset of these issues. He maintains a diet focused on whole, organic foods and pasture-raised meats, motivated by a strong family history of autoimmunity, including multiple sclerosis in his mother (diagnosed in 70 and was wheelchair-bound by the mid-1990s) and rheumatoid arthritis in both paternal grandparents.  In 2022, he experienced a brief episode of atrial fibrillation after consuming a large smoothie, with associated palpitations and near-syncope. The episode resolved with a Valsalva maneuver. Cardiac workup, including echocardiogram, EKG, and blood work, was unremarkable. He declined a stress test. No personal history of coronary artery disease, and he reports no family history of coronary artery disease.  He has not had a recurrence of a fib.   Past NSAIDS/analgesics:  Celebrex Past antihypertensive medications:  losartan , lisinopril -hydrochlorothiazide  Past antidepressant:  Wellbutrin  (for presumed chronic fatigue syndrome and anxiety) Past antihistamines/decongestants:  loratadine  Current Antihypertensive medications:  lisinopril     PAST MEDICAL HISTORY: Past Medical History:  Diagnosis Date   Allergy    Fainting spell    Giardia 2009   Hypertension     PAST SURGICAL HISTORY: Past Surgical History:  Procedure Laterality Date   SPERMATOCELECTOMY  2000   Dr Alline   TONSILLECTOMY  1992   Done in [place]    MEDICATIONS: Medications Ordered Prior to Encounter[1]  ALLERGIES: Allergies[2]  FAMILY HISTORY: Family History  Problem Relation Age of Onset   Hypertension Mother    Colon polyps Father  Hyperlipidemia Father    Hypertension Father    Gout Father    Stroke Maternal Grandmother    Hypertension Maternal Grandmother    Cancer Maternal  Grandfather        lung   Arthritis Paternal Grandmother        rheumatoid   Arthritis Paternal Grandfather        rheumatoid   Gout Paternal Grandfather    Colon cancer Neg Hx    Esophageal cancer Neg Hx    Stomach cancer Neg Hx    Rectal cancer Neg Hx     Objective:  Blood pressure (!) 160/81, pulse 67, height 5' 9 (1.753 m), weight 203 lb (92.1 kg), SpO2 99%. General: No acute distress.  Patient appears well-groomed.   Head:  Normocephalic/atraumatic Eyes:  fundi examined but not visualized Neck: supple, no paraspinal tenderness, full range of motion Heart: regular rate and rhythm Neurological Exam: Mental status: alert and oriented to person, place, and time, speech fluent and not dysarthric, language intact. Cranial nerves: CN I: not tested CN II: pupils equal, round and reactive to light, visual fields intact CN III, IV, VI:  full range of motion, no nystagmus, no ptosis CN V: facial sensation intact. CN VII: upper and lower face symmetric CN VIII: hearing intact CN IX, X: gag intact, uvula midline CN XI: sternocleidomastoid and trapezius muscles intact CN XII: tongue midline Bulk & Tone: normal, no fasciculations. Motor:  muscle strength 5-/5 left hip flexion, otherwise 5/5 throughout Sensation:  Pinprick sensation slightly reduced in 5th digit and ulnar distribution of left hand; and vibratory sensation intact. Deep Tendon Reflexes:  2+ throughout,  toes downgoing.   Finger to nose testing:  Without dysmetria.   Gait:  Normal station and stride.  able to walk in tandem.  Romberg negative.    Juliene Dunnings, DO  CC: Lequita Flor, MD        [1]  Current Outpatient Medications on File Prior to Visit  Medication Sig Dispense Refill   buPROPion  (WELLBUTRIN  XL) 150 MG 24 hr tablet Take 1 tablet (150 mg total) by mouth daily. 90 tablet 3   busPIRone  (BUSPAR ) 15 MG tablet Take 1 tablet (15 mg total) by mouth 2 (two) times daily. 180 tablet 3   Cholecalciferol  (VITAMIN D) 1000 UNITS capsule Take 2,000 Units by mouth daily.     clobetasol  cream (TEMOVATE ) 0.05 % Apply topically to the affected area 2 times daily as needed 45 g 6   flunisolide  (NASAREL ) 29 MCG/ACT (0.025%) nasal spray Place 2 sprays into the nose 2 (two) times daily. Dose is for each nostril. 25 mL 3   fluticasone (FLONASE) 50 MCG/ACT nasal spray 2 sprays  in each nostril     lisinopril  (ZESTRIL ) 20 MG tablet Take 1 tablet (20 mg total) by mouth daily. 90 tablet 4   loratadine (CLARITIN) 10 MG tablet Take 10 mg by mouth as needed.     Multiple Vitamin (MULTIVITAMIN) tablet Take 1 tablet by mouth daily.     Pramoxine HCl (PROCTOFOAM  RE) Place rectally as needed.     No current facility-administered medications on file prior to visit.  [2] No Known Allergies  "

## 2024-08-28 ENCOUNTER — Encounter: Payer: Self-pay | Admitting: Neurology
